# Patient Record
Sex: Female | Born: 1965 | Race: Asian | Hispanic: No | Marital: Married
Health system: Midwestern US, Community
[De-identification: ages and names within clinical notes are randomized; demographics above are authoritative.]

## PROBLEM LIST (undated history)

## (undated) DIAGNOSIS — E785 Hyperlipidemia, unspecified: Secondary | ICD-10-CM

## (undated) DIAGNOSIS — E039 Hypothyroidism, unspecified: Secondary | ICD-10-CM

## (undated) DIAGNOSIS — D869 Sarcoidosis, unspecified: Secondary | ICD-10-CM

## (undated) DIAGNOSIS — M4316 Spondylolisthesis, lumbar region: Secondary | ICD-10-CM

## (undated) HISTORY — DX: Hypothyroidism, unspecified: E03.9

## (undated) HISTORY — DX: Sarcoidosis, unspecified: D86.9

---

## 2007-12-17 ENCOUNTER — Ambulatory Visit: Payer: Self-pay | Admitting: Internal Medicine

## 2008-05-15 ENCOUNTER — Ambulatory Visit: Payer: Self-pay | Admitting: Internal Medicine

## 2008-07-31 ENCOUNTER — Ambulatory Visit: Payer: Self-pay | Admitting: Internal Medicine

## 2008-08-10 ENCOUNTER — Ambulatory Visit: Payer: Self-pay | Admitting: Sports Medicine

## 2008-08-18 ENCOUNTER — Encounter: Payer: Self-pay | Admitting: Sports Medicine

## 2008-09-06 ENCOUNTER — Encounter: Payer: Self-pay | Admitting: Sports Medicine

## 2008-10-06 ENCOUNTER — Encounter: Payer: Self-pay | Admitting: Sports Medicine

## 2008-11-18 ENCOUNTER — Ambulatory Visit: Payer: Self-pay | Admitting: Internal Medicine

## 2008-12-02 ENCOUNTER — Ambulatory Visit: Payer: Self-pay | Admitting: Internal Medicine

## 2009-11-19 ENCOUNTER — Ambulatory Visit: Payer: Self-pay

## 2010-12-30 ENCOUNTER — Ambulatory Visit: Payer: Self-pay | Admitting: Internal Medicine

## 2011-01-21 ENCOUNTER — Ambulatory Visit: Payer: Self-pay | Admitting: Internal Medicine

## 2012-02-12 ENCOUNTER — Encounter: Payer: Self-pay | Admitting: Internal Medicine

## 2012-03-06 ENCOUNTER — Encounter: Payer: Self-pay | Admitting: Internal Medicine

## 2012-04-06 ENCOUNTER — Encounter: Payer: Self-pay | Admitting: Internal Medicine

## 2012-04-08 ENCOUNTER — Ambulatory Visit: Payer: Self-pay | Admitting: Internal Medicine

## 2012-08-27 ENCOUNTER — Ambulatory Visit: Payer: Self-pay | Admitting: Obstetrics and Gynecology

## 2012-09-09 ENCOUNTER — Ambulatory Visit: Payer: Self-pay | Admitting: Internal Medicine

## 2012-10-22 ENCOUNTER — Ambulatory Visit: Payer: Self-pay | Admitting: Orthopedic Surgery

## 2013-01-08 ENCOUNTER — Ambulatory Visit: Payer: Self-pay | Admitting: Otolaryngology

## 2013-01-13 ENCOUNTER — Ambulatory Visit: Payer: Self-pay | Admitting: Otolaryngology

## 2013-05-27 ENCOUNTER — Ambulatory Visit: Payer: Self-pay | Admitting: Obstetrics and Gynecology

## 2013-11-11 ENCOUNTER — Ambulatory Visit: Payer: Self-pay | Admitting: General Practice

## 2014-08-20 ENCOUNTER — Ambulatory Visit: Payer: Self-pay | Admitting: Obstetrics and Gynecology

## 2014-09-15 LAB — HM PAP SMEAR: HM PAP: NORMAL

## 2015-02-26 NOTE — Op Note (Signed)
PATIENT NAME:  Barbara Harris, Barbara Harris MR#:  [id number] DATE OF BIRTH:  12/21/1965  DATE OF PROCEDURE:  01/13/2013  PREOPERATIVE DIAGNOSIS:  1. Lymphadenopathy, left neck.  2. Xerostomia, possible Sjogren's.  POSTOPERATIVE DIAGNOSIS:  1. Lymphadenopathy, left neck. 2. Xerostomia, possible Sjogren's.  PROCEDURE PERFORMED:  1. Excisional biopsy of a deep neck lymph node.  2. Salivary gland biopsy.    ANESTHESIA: General endotracheal anesthesia.   ESTIMATED BLOOD LOSS: Less than 5 mL.   IV FLUIDS: Please see anesthesia record.   COMPLICATIONS: None.   DRAINS AND STENT PLACEMENTS: None.   SPECIMENS:  1. Multiple deep neck lymph nodes from level 2A and 2 B.  2. Salivary gland biopsy from the left lower lip.   INDICATIONS FOR PROCEDURE: The patient is a 49 year old female with a history of sarcoidosis with progressive worsening lymphadenopathy bilaterally, left greater than right. There was some concern regarding other etiologies, as the patient's blood work is equivocal, and the patient was sent for deep neck lymph node biopsy to rule any more ominous pathology. The patient also has a history of possible Sjogren's and xerostomia and was sent also for a salivary gland biopsy as well.   OPERATIVE FINDINGS: Multiple deep neck lymph nodes excised just adjacent to the spinal accessory nerve on the left side. This accessory nerve was intact and stimulated throughout the duration of the case. These were sent for fresh evaluation for a lymphoma workup. Lower lip salivary gland biopsy also performed.   DESCRIPTION OF PROCEDURE: After the patient was identified in holding, benefits and risks of the procedure were discussed and consent was reviewed, the patient was taken to the Operating Room and placed in the supine position. General endotracheal anesthesia was induced, and a shoulder roll was placed. The patient was appropriately positioned for her left neck. Palpation revealed multiple lymph nodes. A  horizontal skin crease was chosen and marked, and 2 mL of 1% lidocaine was injected into the left neck. Another 0.5 mL was injected into the patient's left lower lip. At this time, the patient's neck was prepped and draped in a sterile fashion. A 15-blade scalpel was used to make a horizontal skin incision just along the posterior aspect of the sternocleidomastoid muscle overlying the lymphadenopathy. Dissection was carried bluntly through the subcutaneous tissues until the fascia of the posterior aspect of the sternocleidomastoid was encountered. Just beneath this, exiting the SCM, was the spinal accessory nerve. This was robustly stimulated with the nerve stimulator. Dissection posterior to this revealed a large conglomeration of lymph nodes with the largest approximately 1 cm as well as anterior to the spinal accessory nerve, another lymph node conglomeration of approximately 1 cm. These were both bluntly dissected as well as with bipolar cauterization away from the neck, and care was taken to avoid injury of the spinal accessory nerve, and these were passed off the table for a fresh pathological evaluation. At this time, the patient's neck was copiously irrigated with sterile saline. Meticulous hemostasis was achieved. The spinal accessory nerve was robustly stimulated again, and the neck was closed in a multilayered fashion with deep Vicryls as well as the skin was closed with Dermabond and a Steri-Strip.   At this time, attention was directed to the patient's oral cavity. After this was prepped with Betadine, the left lower lip was palpated. A palpable salivary gland was felt, and a 15-blade scalpel was used to make a horizontal incision. Dissection was carried down to the mucosal layers, and multiple salivary glands were encountered.  A conglomeration of these were meticulously dissected from the deep tissue and sent off for permanent pathological evaluation for Sjogren's. At this time, the patient's oral  cavity was irrigated with sterile saline, and this was closed with 5-0 chromic suture. The patient tolerated the procedure well. At this time, care of the patient was transferred to Anesthesia.    ____________________________ Kyung Ruddreighton C. Vaught, MD ccv:lo D: 01/13/2013 09:54:02 ET T: 01/13/2013 11:23:57 ET JOB#: [id number]  cc: Kyung Ruddreighton C. Vaught, MD, <Dictator> Kyung RuddREIGHTON C VAUGHT MD ELECTRONICALLY SIGNED 01/16/2013 16:18

## 2016-02-28 DIAGNOSIS — Z0000 Encounter for general adult medical examination without abnormal findings: Secondary | ICD-10-CM | POA: Diagnosis not present

## 2016-02-28 DIAGNOSIS — E039 Hypothyroidism, unspecified: Secondary | ICD-10-CM | POA: Diagnosis not present

## 2016-02-28 DIAGNOSIS — E7800 Pure hypercholesterolemia, unspecified: Secondary | ICD-10-CM | POA: Diagnosis not present

## 2016-03-03 DIAGNOSIS — E7800 Pure hypercholesterolemia, unspecified: Secondary | ICD-10-CM | POA: Diagnosis not present

## 2016-03-03 DIAGNOSIS — E039 Hypothyroidism, unspecified: Secondary | ICD-10-CM | POA: Diagnosis not present

## 2016-03-03 DIAGNOSIS — E611 Iron deficiency: Secondary | ICD-10-CM | POA: Diagnosis not present

## 2016-03-03 DIAGNOSIS — R7989 Other specified abnormal findings of blood chemistry: Secondary | ICD-10-CM | POA: Diagnosis not present

## 2016-08-30 DIAGNOSIS — H40013 Open angle with borderline findings, low risk, bilateral: Secondary | ICD-10-CM | POA: Diagnosis not present

## 2016-09-04 DIAGNOSIS — E039 Hypothyroidism, unspecified: Secondary | ICD-10-CM | POA: Diagnosis not present

## 2016-09-04 DIAGNOSIS — E7800 Pure hypercholesterolemia, unspecified: Secondary | ICD-10-CM | POA: Diagnosis not present

## 2016-09-04 DIAGNOSIS — R7989 Other specified abnormal findings of blood chemistry: Secondary | ICD-10-CM | POA: Diagnosis not present

## 2016-09-04 DIAGNOSIS — E611 Iron deficiency: Secondary | ICD-10-CM | POA: Diagnosis not present

## 2016-09-08 ENCOUNTER — Other Ambulatory Visit: Payer: Self-pay | Admitting: Obstetrics and Gynecology

## 2016-09-08 DIAGNOSIS — Z1231 Encounter for screening mammogram for malignant neoplasm of breast: Secondary | ICD-10-CM

## 2016-09-08 DIAGNOSIS — E7800 Pure hypercholesterolemia, unspecified: Secondary | ICD-10-CM | POA: Diagnosis not present

## 2016-09-08 DIAGNOSIS — R7989 Other specified abnormal findings of blood chemistry: Secondary | ICD-10-CM | POA: Diagnosis not present

## 2016-09-08 DIAGNOSIS — Z0000 Encounter for general adult medical examination without abnormal findings: Secondary | ICD-10-CM | POA: Diagnosis not present

## 2016-09-08 DIAGNOSIS — E039 Hypothyroidism, unspecified: Secondary | ICD-10-CM | POA: Diagnosis not present

## 2016-09-13 ENCOUNTER — Ambulatory Visit: Payer: Self-pay | Admitting: Physician Assistant

## 2016-09-13 ENCOUNTER — Encounter: Payer: Self-pay | Admitting: Physician Assistant

## 2016-09-13 VITALS — BP 100/60 | HR 84 | Temp 98.5°F

## 2016-09-13 DIAGNOSIS — J020 Streptococcal pharyngitis: Secondary | ICD-10-CM

## 2016-09-13 LAB — POCT RAPID STREP A (OFFICE): RAPID STREP A SCREEN: POSITIVE — AB

## 2016-09-13 MED ORDER — AMOXICILLIN 875 MG PO TABS: 875.0000 mg | ORAL_TABLET | Freq: Two times a day (BID) | ORAL | 0 refills | Status: DC

## 2016-09-13 NOTE — Progress Notes (Signed)
S: c/o sore throat and fever, her son had strep 3 weeks ago and was given pcn but is better now, her sx started a few days ago and fever started this am, no cough or congestion, some ear pain, no v/d  O: vitals wnl, nad, tms dull, throat red, swollen, neck supple +anterior cervical lymph b/l, lungs c t a , cv rrr, q strep +  A: strep throat  P: amoxil 875mg  bid, work note given as is contagious

## 2016-09-15 ENCOUNTER — Encounter: Payer: Self-pay | Admitting: Radiology

## 2016-09-15 ENCOUNTER — Ambulatory Visit
Admission: RE | Admit: 2016-09-15 | Discharge: 2016-09-15 | Disposition: A | Discharge status: Home / Self Care | Payer: 59 | Source: Ambulatory Visit | Attending: Obstetrics and Gynecology | Admitting: Obstetrics and Gynecology

## 2016-09-15 DIAGNOSIS — Z1231 Encounter for screening mammogram for malignant neoplasm of breast: Secondary | ICD-10-CM | POA: Diagnosis not present

## 2016-09-26 ENCOUNTER — Ambulatory Visit: Payer: Self-pay | Admitting: Physician Assistant

## 2016-09-26 ENCOUNTER — Encounter: Payer: Self-pay | Admitting: Physician Assistant

## 2016-09-26 VITALS — BP 100/70 | HR 72 | Temp 97.9°F

## 2016-09-26 DIAGNOSIS — J029 Acute pharyngitis, unspecified: Secondary | ICD-10-CM

## 2016-09-26 LAB — POCT RAPID STREP A (OFFICE): RAPID STREP A SCREEN: NEGATIVE

## 2016-09-26 MED ORDER — AZITHROMYCIN 250 MG PO TABS: ORAL_TABLET | ORAL | 0 refills | Status: DC

## 2016-09-26 MED ORDER — FLUCONAZOLE 150 MG PO TABS: ORAL_TABLET | ORAL | 0 refills | Status: DC

## 2016-09-26 NOTE — Progress Notes (Signed)
S: had + strep test then took amoxil, started itching with the amoxil but continued to take it until the last day when her lips got swollen and face got swollen, no dif breathing, since then the sore throat has returned, no fever like last time but it feels the same, no v/d, no rash, no cp/sob  O: vitals wnl, nad, ent w injected throat, neck supple no lymph, lungs c t a, cv rrr q strep neg  A: acute pharyngitis, new onset pcn allergy  P: zpack, diflucan,

## 2017-01-15 ENCOUNTER — Inpatient Hospital Stay: Admit: 2017-01-15 | Attending: Family Medicine | Primary: Family Medicine

## 2017-01-15 NOTE — Other (Unsigned)
Patient Acct Nbr: [id number]   Primary AUTH/CERT:   Primary Insurance Company Name: [employer]  Primary Insurance Plan name: [facility]  Primary Insurance Group Number:   Primary Insurance Plan Type: Health  Primary Insurance Policy Number: [id number]

## 2017-09-03 DIAGNOSIS — H5203 Hypermetropia, bilateral: Secondary | ICD-10-CM | POA: Diagnosis not present

## 2017-09-14 DIAGNOSIS — D869 Sarcoidosis, unspecified: Secondary | ICD-10-CM | POA: Diagnosis not present

## 2017-09-14 DIAGNOSIS — E039 Hypothyroidism, unspecified: Secondary | ICD-10-CM | POA: Diagnosis not present

## 2017-09-14 DIAGNOSIS — Z0000 Encounter for general adult medical examination without abnormal findings: Secondary | ICD-10-CM | POA: Diagnosis not present

## 2017-09-14 DIAGNOSIS — E782 Mixed hyperlipidemia: Secondary | ICD-10-CM | POA: Diagnosis not present

## 2017-11-12 ENCOUNTER — Other Ambulatory Visit: Payer: Self-pay | Admitting: Obstetrics and Gynecology

## 2017-11-12 DIAGNOSIS — Z1231 Encounter for screening mammogram for malignant neoplasm of breast: Secondary | ICD-10-CM

## 2017-11-26 ENCOUNTER — Ambulatory Visit
Admission: RE | Admit: 2017-11-26 | Discharge: 2017-11-26 | Disposition: A | Discharge status: Home / Self Care | Payer: No Typology Code available for payment source | Source: Ambulatory Visit | Attending: Obstetrics and Gynecology | Admitting: Obstetrics and Gynecology

## 2017-11-26 DIAGNOSIS — Z1231 Encounter for screening mammogram for malignant neoplasm of breast: Secondary | ICD-10-CM | POA: Insufficient documentation

## 2017-11-30 ENCOUNTER — Ambulatory Visit (INDEPENDENT_AMBULATORY_CARE_PROVIDER_SITE_OTHER): Payer: No Typology Code available for payment source | Admitting: Obstetrics and Gynecology

## 2017-11-30 ENCOUNTER — Encounter: Payer: Self-pay | Admitting: Obstetrics and Gynecology

## 2017-11-30 VITALS — BP 110/64 | Ht 61.0 in | Wt 114.0 lb

## 2017-11-30 DIAGNOSIS — Z1331 Encounter for screening for depression: Secondary | ICD-10-CM | POA: Diagnosis not present

## 2017-11-30 DIAGNOSIS — Z01419 Encounter for gynecological examination (general) (routine) without abnormal findings: Secondary | ICD-10-CM | POA: Diagnosis not present

## 2017-11-30 DIAGNOSIS — Z1339 Encounter for screening examination for other mental health and behavioral disorders: Secondary | ICD-10-CM

## 2017-11-30 DIAGNOSIS — Z124 Encounter for screening for malignant neoplasm of cervix: Secondary | ICD-10-CM | POA: Diagnosis not present

## 2017-11-30 DIAGNOSIS — R6882 Decreased libido: Secondary | ICD-10-CM | POA: Diagnosis not present

## 2017-11-30 NOTE — Progress Notes (Signed)
Routine Annual Gynecology Examination   PCP: Barbette Reichmann, MD  Chief Complaint  Patient presents with  . Annual Exam    History of Present Illness: Patient is an established patient, Barbara y.o. G1P1001 presents for annual exam. The patient has no complaints today.   Menopausal bleeding: denies x 3 years  Menopausal symptoms: denies  Breast symptoms: denies  Last pap smear: 3 years ago.  Result Normal  Last mammogram: 4 days ago.  Result Normal   Decrease in sexual interest.  Some pain with intercourse at all levels of penetration.  Past Medical History:  Diagnosis Date  . Hypothyroidism   . Sarcoidosis     Past Surgical History:  Procedure Laterality Date  . CESAREAN SECTION      Prior to Admission medications   Medication Sig Start Date End Date Taking? Authorizing Provider  Cholecalciferol (VITAMIN D PO) Take by mouth.   Yes [provider]  ferrous sulfate 325 (65 FE) MG tablet Take by mouth.   Yes [provider]  levothyroxine (SYNTHROID) 88 MCG tablet Take by mouth. 09/06/15  Yes [provider]    Allergies  Allergen Reactions  . Amoxil [Amoxicillin] Swelling   Obstetric History: G1P1001, s/p Cesarean section  Social History   Socioeconomic History  . Marital status: Married    Spouse name: Not on file  . Number of children: Not on file  . Years of education: Not on file  . Highest education level: Not on file  Social Needs  . Financial resource strain: Not on file  . Food insecurity - worry: Not on file  . Food insecurity - inability: Not on file  . Transportation needs - medical: Not on file  . Transportation needs - non-medical: Not on file  Occupational History  . Not on file  Tobacco Use  . Smoking status: Never Smoker  . Smokeless tobacco: Never Used  Substance and Sexual Activity  . Alcohol use: No    Frequency: Never  . Drug use: No  . Sexual activity: Yes    Birth control/protection:  Post-menopausal  Other Topics Concern  . Not on file  Social History Narrative  . Not on file    Family History  Problem Relation Age of Onset  . Hypertension Father   . Esophageal cancer Brother     Review of Systems  Constitutional: Negative.   HENT: Negative.   Eyes: Negative.   Respiratory: Negative.   Cardiovascular: Negative.   Gastrointestinal: Negative.   Genitourinary: Negative.   Musculoskeletal: Negative.   Skin: Negative.   Neurological: Negative.   Psychiatric/Behavioral: Negative.      Physical Exam Vitals: BP 110/64   Ht 5\' 1"  (1.549 m)   Wt 114 lb (51.7 kg)   BMI 21.54 kg/m   Physical Exam  Constitutional: She is oriented to person, place, and time. She appears well-developed and well-nourished. No distress.  Genitourinary: Uterus normal. Pelvic exam was performed with patient supine. There is no rash, tenderness, lesion or injury on the right labia. There is no rash, tenderness, lesion or injury on the left labia. No erythema, tenderness or bleeding in the vagina. No signs of injury around the vagina. No vaginal discharge found. Right adnexum does not display mass, does not display tenderness and does not display fullness. Left adnexum does not display mass, does not display tenderness and does not display fullness. Cervix does not exhibit motion tenderness, lesion, discharge or polyp.   Uterus is mobile and anteverted. Uterus  is not enlarged, tender or exhibiting a mass.  HENT:  Head: Normocephalic and atraumatic.  Eyes: EOM are normal. No scleral icterus.  Neck: Normal range of motion. Neck supple. No thyromegaly present.  Cardiovascular: Normal rate and regular rhythm. Exam reveals no gallop and no friction rub.  No murmur heard. Pulmonary/Chest: Effort normal and breath sounds normal. No respiratory distress. She has no wheezes. She has no rales. Right breast exhibits no inverted nipple, no mass, no nipple discharge, no skin change and no tenderness.  Left breast exhibits no inverted nipple, no mass, no nipple discharge, no skin change and no tenderness.  Abdominal: Soft. Bowel sounds are normal. She exhibits no distension and no mass. There is no tenderness. There is no rebound and no guarding.  Musculoskeletal: Normal range of motion. She exhibits no edema or tenderness.  Lymphadenopathy:    She has no cervical adenopathy.       Right: No inguinal adenopathy present.       Left: No inguinal adenopathy present.  Neurological: She is alert and oriented to person, place, and time. No cranial nerve deficit.  Skin: Skin is warm and dry. No rash noted. No erythema.  Psychiatric: She has a normal mood and affect. Her behavior is normal. Judgment normal.    Female chaperone present for pelvic and breast  portions of the physical exam  Results: AUDIT Questionnaire (screen for alcoholism): 0 PHQ-9: 3   Assessment and Plan:  52 y.o. 211P1001 female here for routine annual gynecologic examination  Plan: Problem List Items Addressed This Visit    None    Visit Diagnoses    Women's annual routine gynecological examination    -  Primary   Relevant Orders   IGP, Aptima HPV, rfx 16/18,45   Screening for depression       Screening for alcoholism       Decreased libido       Pap smear for cervical cancer screening       Relevant Orders   IGP, Aptima HPV, rfx 16/18,45      Screening: -- Blood pressure screen normal -- Colonoscopy - due - managed by PCP -- Mammogram - not due until next year -- Weight screening: normal -- Depression screening negative (PHQ-9) -- Nutrition: normal -- cholesterol screening: per PCP -- osteoporosis screening: not due -- tobacco screening: not using -- alcohol screening: AUDIT questionnaire indicates low-risk usage. -- family history of breast cancer screening: done. not at high risk. -- no evidence of domestic violence or intimate partner violence. -- STD screening: gonorrhea/chlamydia NAAT not  collected per patient request. -- pap smear collected per ASCCP guidelines -- HPV vaccination series: not eligilbe  Decreased libido: discussed multiple factors involved.  Discussed use of testosterone potentially. She is not interested in using this and I do not recommend this as a first-line treatment.  Use of OTC lubricants as a first line for now.   Thomasene MohairStephen Shant Hence, MD 11/30/2017 8:38 AM

## 2017-12-04 LAB — IGP, APTIMA HPV, RFX 16/18,45
HPV Aptima: NEGATIVE
PAP SMEAR COMMENT: 0

## 2018-06-10 ENCOUNTER — Other Ambulatory Visit: Payer: Self-pay | Admitting: Internal Medicine

## 2018-06-10 ENCOUNTER — Ambulatory Visit
Admission: RE | Admit: 2018-06-10 | Discharge: 2018-06-10 | Disposition: A | Discharge status: Home / Self Care | Payer: No Typology Code available for payment source | Source: Ambulatory Visit | Attending: Internal Medicine | Admitting: Internal Medicine

## 2018-06-10 DIAGNOSIS — M79604 Pain in right leg: Secondary | ICD-10-CM

## 2018-06-10 DIAGNOSIS — E7800 Pure hypercholesterolemia, unspecified: Secondary | ICD-10-CM | POA: Diagnosis present

## 2018-06-10 DIAGNOSIS — E039 Hypothyroidism, unspecified: Secondary | ICD-10-CM | POA: Diagnosis present

## 2018-06-10 DIAGNOSIS — R7989 Other specified abnormal findings of blood chemistry: Secondary | ICD-10-CM | POA: Diagnosis not present

## 2018-06-10 DIAGNOSIS — M7989 Other specified soft tissue disorders: Secondary | ICD-10-CM | POA: Insufficient documentation

## 2018-08-29 ENCOUNTER — Encounter: Payer: Self-pay | Admitting: *Deleted

## 2018-08-30 ENCOUNTER — Encounter
Admission: RE | Disposition: A | Discharge status: Home / Self Care | Payer: Self-pay | Source: Ambulatory Visit | Attending: Unknown Physician Specialty

## 2018-08-30 ENCOUNTER — Ambulatory Visit: Payer: No Typology Code available for payment source | Admitting: Anesthesiology

## 2018-08-30 ENCOUNTER — Ambulatory Visit
Admission: RE | Admit: 2018-08-30 | Discharge: 2018-08-30 | Disposition: A | Discharge status: Home / Self Care | Payer: No Typology Code available for payment source | Source: Ambulatory Visit | Attending: Unknown Physician Specialty | Admitting: Unknown Physician Specialty

## 2018-08-30 ENCOUNTER — Encounter: Payer: Self-pay | Admitting: *Deleted

## 2018-08-30 DIAGNOSIS — Z79899 Other long term (current) drug therapy: Secondary | ICD-10-CM | POA: Diagnosis not present

## 2018-08-30 DIAGNOSIS — E785 Hyperlipidemia, unspecified: Secondary | ICD-10-CM | POA: Diagnosis not present

## 2018-08-30 DIAGNOSIS — Z1211 Encounter for screening for malignant neoplasm of colon: Secondary | ICD-10-CM | POA: Insufficient documentation

## 2018-08-30 DIAGNOSIS — E039 Hypothyroidism, unspecified: Secondary | ICD-10-CM | POA: Insufficient documentation

## 2018-08-30 DIAGNOSIS — K621 Rectal polyp: Secondary | ICD-10-CM | POA: Diagnosis not present

## 2018-08-30 DIAGNOSIS — D869 Sarcoidosis, unspecified: Secondary | ICD-10-CM | POA: Diagnosis not present

## 2018-08-30 DIAGNOSIS — Z79890 Hormone replacement therapy (postmenopausal): Secondary | ICD-10-CM | POA: Diagnosis not present

## 2018-08-30 HISTORY — DX: Spondylolisthesis, lumbar region: M43.16

## 2018-08-30 HISTORY — DX: Hyperlipidemia, unspecified: E78.5

## 2018-08-30 HISTORY — PX: COLONOSCOPY WITH PROPOFOL: SHX5780

## 2018-08-30 SURGERY — COLONOSCOPY WITH PROPOFOL
Anesthesia: General

## 2018-08-30 MED ORDER — PROPOFOL 500 MG/50ML IV EMUL
INTRAVENOUS | Status: AC
  Filled 2018-08-30: qty 50

## 2018-08-30 MED ORDER — SODIUM CHLORIDE 0.9 % IV SOLN
INTRAVENOUS | Status: DC
  Administered 2018-08-30: 1000 mL via INTRAVENOUS

## 2018-08-30 MED ORDER — LIDOCAINE HCL (PF) 2 % IJ SOLN
INTRAMUSCULAR | Status: AC
  Filled 2018-08-30: qty 10

## 2018-08-30 MED ORDER — PROPOFOL 500 MG/50ML IV EMUL
INTRAVENOUS | Status: DC | PRN
  Administered 2018-08-30: 170 ug/kg/min via INTRAVENOUS

## 2018-08-30 MED ORDER — PROPOFOL 10 MG/ML IV BOLUS
INTRAVENOUS | Status: DC | PRN
  Administered 2018-08-30: 80 mg via INTRAVENOUS

## 2018-08-30 MED ORDER — FENTANYL CITRATE (PF) 100 MCG/2ML IJ SOLN
INTRAMUSCULAR | Status: AC
  Filled 2018-08-30: qty 2

## 2018-08-30 MED ORDER — PHENYLEPHRINE HCL 10 MG/ML IJ SOLN
INTRAMUSCULAR | Status: DC | PRN
  Administered 2018-08-30: 100 ug via INTRAVENOUS

## 2018-08-30 MED ORDER — SODIUM CHLORIDE 0.9 % IV SOLN: INTRAVENOUS | Status: DC

## 2018-08-30 MED ORDER — LIDOCAINE 2% (20 MG/ML) 5 ML SYRINGE
INTRAMUSCULAR | Status: DC | PRN
  Administered 2018-08-30: 30 mg via INTRAVENOUS

## 2018-08-30 MED ORDER — FENTANYL CITRATE (PF) 100 MCG/2ML IJ SOLN
INTRAMUSCULAR | Status: DC | PRN
  Administered 2018-08-30: 50 ug via INTRAVENOUS

## 2018-08-30 NOTE — Op Note (Signed)
[facility] Gastroenterology Patient Name: Barbara Harris Procedure Date: 08/30/2018 8:48 AM MRN: [id number] Account #: [id number] Date of Birth: Nov 05, 1966 Admit Type: Outpatient Age: 52 Room: [facility] ENDO ROOM 1 Gender: Female Note Status: Finalized Procedure:            Colonoscopy Indications:          Screening for colorectal malignant neoplasm Providers:            Scot Jun, MD Referring MD:         Barbette Reichmann, MD (Referring MD) Medicines:            Propofol per Anesthesia Complications:        No immediate complications. Procedure:            Pre-Anesthesia Assessment:                       - After reviewing the risks and benefits, the patient                        was deemed in satisfactory condition to undergo the                        procedure.                       After obtaining informed consent, the colonoscope was                        passed under direct vision. Throughout the procedure,                        the patient's blood pressure, pulse, and oxygen                        saturations were monitored continuously. The                        Colonoscope was introduced through the anus and                        advanced to the the cecum, identified by appendiceal                        orifice and ileocecal valve. The colonoscopy was                        performed without difficulty. The patient tolerated the                        procedure well. The quality of the bowel preparation                        was excellent. Findings:      A diminutive polyp was found in the rectum. The polyp was sessile. The       polyp was removed with a jumbo cold forceps. Resection and retrieval       were complete.      The terminal ileum appeared normal. The inside of the terminal ileum was       normal. The edges of the ileocecal valve showed a mild covering which  suggests possible polyp tissue and was biopsied once. Impression:            - One diminutive polyp in the rectum, removed with a                        jumbo cold forceps. Resected and retrieved.                       - The examined portion of the ileum was normal. Recommendation:       - Await pathology results. Scot Jun, MD 08/30/2018 9:15:49 AM This report has been signed electronically. Number of Addenda: 0 Note Initiated On: 08/30/2018 8:48 AM Scope Withdrawal Time: 0 hours 11 minutes 17 seconds  Total Procedure Duration: 0 hours 17 minutes 37 seconds       [facility]

## 2018-08-30 NOTE — H&P (Signed)
Primary Care Physician:  Barbette Reichmann, MD Primary Gastroenterologist:  Dr. Mechele Collin  Pre-Procedure History & Physical: HPI:  Barbara Harris is a 52 y.o. female is here for an colonoscopy for screening  Colon exam.   Past Medical History:  Diagnosis Date  . Hyperlipidemia   . Hypothyroidism   . Sarcoidosis   . Spondylolisthesis of lumbar region     Past Surgical History:  Procedure Laterality Date  . CESAREAN SECTION      Prior to Admission medications   Medication Sig Start Date End Date Taking? Authorizing Provider  Multiple Vitamin (MULTIVITAMIN) tablet Take 1 tablet by mouth daily.   Yes [provider]  rosuvastatin (CRESTOR) 5 MG tablet Take 5 mg by mouth daily.   Yes [provider]  vitamin B-12 (CYANOCOBALAMIN) 500 MCG tablet Take 500 mcg by mouth daily.   Yes [provider]  ferrous sulfate 325 (65 FE) MG tablet Take by mouth.    [provider]  levothyroxine (SYNTHROID) 88 MCG tablet Take by mouth. 09/06/15   [provider]    Allergies as of 04/24/2018 - Review Complete 11/30/2017  Allergen Reaction Noted  . Amoxil [amoxicillin] Swelling 09/26/2016    Family History  Problem Relation Age of Onset  . Hypertension Father   . Esophageal cancer Brother     Social History   Socioeconomic History  . Marital status: Married    Spouse name: Not on file  . Number of children: Not on file  . Years of education: Not on file  . Highest education level: Not on file  Occupational History  . Not on file  Social Needs  . Financial resource strain: Not on file  . Food insecurity:    Worry: Not on file    Inability: Not on file  . Transportation needs:    Medical: Not on file    Non-medical: Not on file  Tobacco Use  . Smoking status: Never Smoker  . Smokeless tobacco: Never Used  Substance and Sexual Activity  . Alcohol use: No    Frequency: Never  . Drug use: No  . Sexual activity: Yes    Birth  control/protection: Post-menopausal  Lifestyle  . Physical activity:    Days per week: Not on file    Minutes per session: Not on file  . Stress: Not on file  Relationships  . Social connections:    Talks on phone: Not on file    Gets together: Not on file    Attends religious service: Not on file    Active member of club or organization: Not on file    Attends meetings of clubs or organizations: Not on file    Relationship status: Not on file  . Intimate partner violence:    Fear of current or ex partner: Not on file    Emotionally abused: Not on file    Physically abused: Not on file    Forced sexual activity: Not on file  Other Topics Concern  . Not on file  Social History Narrative  . Not on file    Review of Systems: See HPI, otherwise negative ROS  Physical Exam: BP 119/83   Pulse 70   Temp (!) 95.9 F (35.5 C) (Tympanic)   Resp 18   Ht 5\' 1"  (1.549 m)   Wt 52.6 kg   SpO2 100%   BMI 21.92 kg/m  General:   Alert,  pleasant and cooperative in NAD Head:  Normocephalic and atraumatic. Neck:  Supple; no masses or thyromegaly. Lungs:  Clear throughout to auscultation.    Heart:  Regular rate and rhythm. Abdomen:  Soft, nontender and nondistended. Normal bowel sounds, without guarding, and without rebound.   Neurologic:  Alert and  oriented x4;  grossly normal neurologically.  Impression/Plan: Barbara Harris is here for an colonoscopy to be performed for screening colonoscopy  Risks, benefits, limitations, and alternatives regarding  colonoscopy have been reviewed with the patient.  Questions have been answered.  All parties agreeable.   Lynnae Prude, MD  08/30/2018, 8:45 AM

## 2018-08-30 NOTE — Transfer of Care (Signed)
Immediate Anesthesia Transfer of Care Note  Patient: Barbara Harris  Procedure(s) Performed: COLONOSCOPY WITH PROPOFOL (N/A )  Patient Location: PACU and Endoscopy Unit  Anesthesia Type:General  Level of Consciousness: sedated  Airway & Oxygen Therapy: Patient Spontanous Breathing and Patient connected to nasal cannula oxygen  Post-op Assessment: Report given to RN and Post -op Vital signs reviewed and stable  Post vital signs: Reviewed and stable  Last Vitals:  Vitals Value Taken Time  BP 83/55 08/30/2018  9:15 AM  Temp 36.1 C 08/30/2018  9:15 AM  Pulse    Resp 17 08/30/2018  9:15 AM  SpO2 100 % 08/30/2018  9:15 AM    Last Pain:  Vitals:   08/30/18 0915  TempSrc: Tympanic  PainSc: Asleep      Patients Stated Pain Goal: 0 (08/30/18 1610)  Complications: No apparent anesthesia complications

## 2018-08-30 NOTE — Anesthesia Preprocedure Evaluation (Signed)
Anesthesia Evaluation  Patient identified by MRN, date of birth, ID band Patient awake    Reviewed: Allergy & Precautions, NPO status , Patient's Chart, lab work & pertinent test results, reviewed documented beta blocker date and time   Airway Mallampati: II  TM Distance: >3 FB     Dental  (+) Chipped   Pulmonary           Cardiovascular      Neuro/Psych    GI/Hepatic   Endo/Other  Hypothyroidism   Renal/GU      Musculoskeletal   Abdominal   Peds  Hematology   Anesthesia Other Findings Sarcoid.  Reproductive/Obstetrics                             Anesthesia Physical Anesthesia Plan  ASA: III  Anesthesia Plan: General   Post-op Pain Management:    Induction: Intravenous  PONV Risk Score and Plan:   Airway Management Planned:   Additional Equipment:   Intra-op Plan:   Post-operative Plan:   Informed Consent: I have reviewed the patients History and Physical, chart, labs and discussed the procedure including the risks, benefits and alternatives for the proposed anesthesia with the patient or authorized representative who has indicated his/her understanding and acceptance.     Plan Discussed with: CRNA  Anesthesia Plan Comments:         Anesthesia Quick Evaluation

## 2018-08-30 NOTE — Anesthesia Postprocedure Evaluation (Signed)
Anesthesia Post Note  Patient: Barbara Harris  Procedure(s) Performed: COLONOSCOPY WITH PROPOFOL (N/A )  Patient location during evaluation: Endoscopy Anesthesia Type: General Level of consciousness: awake and alert Pain management: pain level controlled Vital Signs Assessment: post-procedure vital signs reviewed and stable Respiratory status: spontaneous breathing, nonlabored ventilation, respiratory function stable and patient connected to nasal cannula oxygen Cardiovascular status: blood pressure returned to baseline and stable Postop Assessment: no apparent nausea or vomiting Anesthetic complications: no     Last Vitals:  Vitals:   08/30/18 0935 08/30/18 0945  BP: 108/71 96/63  Pulse: 64 (!) 58  Resp: 14 17  Temp:    SpO2: 100% 100%    Last Pain:  Vitals:   08/30/18 0945  TempSrc:   PainSc: 0-No pain                 Amaryllis Malmquist S

## 2018-08-30 NOTE — Anesthesia Post-op Follow-up Note (Signed)
Anesthesia QCDR form completed.        

## 2018-09-02 LAB — SURGICAL PATHOLOGY

## 2018-09-03 ENCOUNTER — Encounter: Payer: Self-pay | Admitting: Unknown Physician Specialty

## 2019-01-29 MED FILL — ROSUVASTATIN CALCIUM 5 MG T: 5 | 90 days supply | Qty: 90 | Fill #0

## 2019-01-29 MED FILL — LEVOTHYROXINE 88 MCG TABLET: 88 | 90 days supply | Qty: 90 | Fill #0

## 2019-01-29 MED FILL — VIT D2 1.25 MG (50,000 UNIT: 1.25 MG | 84 days supply | Qty: 12 | Fill #0

## 2019-09-24 ENCOUNTER — Telehealth: Payer: No Typology Code available for payment source | Admitting: Nurse Practitioner

## 2019-09-24 DIAGNOSIS — M5440 Lumbago with sciatica, unspecified side: Secondary | ICD-10-CM

## 2019-09-24 MED ORDER — CYCLOBENZAPRINE HCL 10 MG PO TABS: 10.0000 mg | ORAL_TABLET | Freq: Three times a day (TID) | ORAL | 1 refills | Status: DC | PRN

## 2019-09-24 MED ORDER — NAPROXEN 500 MG PO TABS: 500.0000 mg | ORAL_TABLET | Freq: Two times a day (BID) | ORAL | 1 refills | Status: DC

## 2019-09-24 NOTE — Progress Notes (Signed)

## 2020-09-08 ENCOUNTER — Other Ambulatory Visit: Payer: Self-pay | Admitting: Internal Medicine

## 2020-10-04 ENCOUNTER — Other Ambulatory Visit: Payer: Self-pay | Admitting: Internal Medicine

## 2020-11-29 ENCOUNTER — Other Ambulatory Visit: Payer: Self-pay | Admitting: Internal Medicine

## 2020-12-10 ENCOUNTER — Ambulatory Visit: Payer: No Typology Code available for payment source

## 2020-12-24 ENCOUNTER — Other Ambulatory Visit: Payer: Self-pay | Admitting: Internal Medicine

## 2020-12-31 ENCOUNTER — Ambulatory Visit: Payer: No Typology Code available for payment source | Attending: Internal Medicine

## 2020-12-31 ENCOUNTER — Other Ambulatory Visit: Payer: Self-pay | Admitting: Internal Medicine

## 2020-12-31 DIAGNOSIS — Z23 Encounter for immunization: Secondary | ICD-10-CM

## 2020-12-31 NOTE — Progress Notes (Signed)
   Covid-19 Vaccination Clinic  Name:  Barbara Harris    MRN: [id number] DOB: 1966/04/21  12/31/2020  [name] was observed post Covid-19 immunization for 15 minutes without incident. She was provided with Vaccine Information Sheet and instruction to access the V-Safe system.   [name] was instructed to call 911 with any severe reactions post vaccine: Marland Kitchen Difficulty breathing  . Swelling of face and throat  . A fast heartbeat  . A bad rash all over body  . Dizziness and weakness   Immunizations Administered    Name Date Dose VIS Date Route   PFIZER Comrnaty(Gray TOP) Covid-19 Vaccine 12/31/2020  8:53 AM 0.3 mL 10/14/2020 Intramuscular   Manufacturer: [employer], [employer]   Lot: [id number]   NDC: [phone]

## 2021-01-06 ENCOUNTER — Other Ambulatory Visit: Payer: Self-pay | Admitting: Internal Medicine

## 2021-01-07 ENCOUNTER — Other Ambulatory Visit: Payer: Self-pay | Admitting: Internal Medicine

## 2021-01-07 ENCOUNTER — Other Ambulatory Visit (HOSPITAL_COMMUNITY): Payer: Self-pay | Admitting: Internal Medicine

## 2021-01-07 DIAGNOSIS — R42 Dizziness and giddiness: Secondary | ICD-10-CM

## 2021-01-07 DIAGNOSIS — R2681 Unsteadiness on feet: Secondary | ICD-10-CM

## 2021-01-07 DIAGNOSIS — H9311 Tinnitus, right ear: Secondary | ICD-10-CM

## 2021-01-11 ENCOUNTER — Ambulatory Visit
Admission: RE | Admit: 2021-01-11 | Discharge: 2021-01-11 | Disposition: A | Discharge status: Home / Self Care | Payer: No Typology Code available for payment source | Source: Ambulatory Visit | Attending: Internal Medicine | Admitting: Internal Medicine

## 2021-01-11 ENCOUNTER — Other Ambulatory Visit: Payer: Self-pay

## 2021-01-11 DIAGNOSIS — R42 Dizziness and giddiness: Secondary | ICD-10-CM | POA: Diagnosis not present

## 2021-01-11 DIAGNOSIS — H9311 Tinnitus, right ear: Secondary | ICD-10-CM | POA: Diagnosis present

## 2021-01-11 DIAGNOSIS — R2681 Unsteadiness on feet: Secondary | ICD-10-CM | POA: Diagnosis present

## 2021-01-18 ENCOUNTER — Ambulatory Visit: Payer: No Typology Code available for payment source

## 2021-01-21 ENCOUNTER — Ambulatory Visit: Payer: No Typology Code available for payment source

## 2021-01-27 ENCOUNTER — Other Ambulatory Visit: Payer: Self-pay | Admitting: Unknown Physician Specialty

## 2021-04-08 ENCOUNTER — Other Ambulatory Visit: Payer: Self-pay

## 2021-04-08 MED ORDER — ROSUVASTATIN CALCIUM 5 MG PO TABS
ORAL_TABLET | ORAL | 3 refills | Status: DC
  Filled 2021-04-08: qty 90, 90d supply, fill #0

## 2021-04-08 MED ORDER — CLOTRIMAZOLE-BETAMETHASONE 1-0.05 % EX CREA
TOPICAL_CREAM | Freq: Two times a day (BID) | CUTANEOUS | 1 refills | Status: DC
  Filled 2021-04-08: qty 45, 20d supply, fill #0

## 2021-05-11 ENCOUNTER — Other Ambulatory Visit: Payer: Self-pay

## 2021-05-11 MED FILL — Levothyroxine Sodium Tab 88 MCG: ORAL | 90 days supply | Qty: 90 | Fill #0 | Status: AC

## 2021-07-08 ENCOUNTER — Other Ambulatory Visit: Payer: Self-pay

## 2021-07-08 ENCOUNTER — Ambulatory Visit (INDEPENDENT_AMBULATORY_CARE_PROVIDER_SITE_OTHER): Payer: No Typology Code available for payment source | Admitting: Obstetrics and Gynecology

## 2021-07-08 ENCOUNTER — Encounter: Payer: Self-pay | Admitting: Obstetrics and Gynecology

## 2021-07-08 VITALS — BP 110/60 | Ht 60.0 in | Wt 110.0 lb

## 2021-07-08 DIAGNOSIS — E2839 Other primary ovarian failure: Secondary | ICD-10-CM

## 2021-07-08 DIAGNOSIS — Z01419 Encounter for gynecological examination (general) (routine) without abnormal findings: Secondary | ICD-10-CM

## 2021-07-08 DIAGNOSIS — N941 Unspecified dyspareunia: Secondary | ICD-10-CM

## 2021-07-08 DIAGNOSIS — Z1339 Encounter for screening examination for other mental health and behavioral disorders: Secondary | ICD-10-CM

## 2021-07-08 DIAGNOSIS — Z1331 Encounter for screening for depression: Secondary | ICD-10-CM

## 2021-07-08 MED ORDER — ESTROGENS, CONJUGATED 0.625 MG/GM VA CREA
1.0000 | TOPICAL_CREAM | VAGINAL | 3 refills | Status: DC
Start: 2021-07-11 — End: 2022-07-19
  Filled 2021-07-08: qty 30, 90d supply, fill #0

## 2021-07-08 NOTE — Progress Notes (Signed)
Routine Annual Gynecology Examination   PCP: Barbette Reichmann, MD  Chief Complaint  Patient presents with   Annual Exam    History of Present Illness: Patient is a 56 y.o. G1P1001 presents for annual exam. The patient has no complaints today.   Menopausal bleeding: denies  Menopausal symptoms: rare.  Painful intercourse.    Breast symptoms: denies  Last pap smear: 3.5 years ago.  Result Normal  Last mammogram: 3.5 years ago.  Result Normal  Last colonoscopy: 2019 - 10 year follow up  Past Medical History:  Diagnosis Date   Hyperlipidemia    Hypothyroidism    Sarcoidosis    Spondylolisthesis of lumbar region     Past Surgical History:  Procedure Laterality Date   CESAREAN SECTION     COLONOSCOPY WITH PROPOFOL N/A 08/30/2018   Procedure: COLONOSCOPY WITH PROPOFOL;  Surgeon: Scot Jun, MD;  Location: [facility] ENDOSCOPY;  Service: Endoscopy;  Laterality: N/A;    Prior to Admission medications   Medication Sig Start Date End Date Taking? Authorizing Provider  COVID-19 mRNA Vac-TriS, Pfizer, SUSP injection USE AS DIRECTED 12/31/20 12/31/21  Judyann Munson, MD  levothyroxine (SYNTHROID) 88 MCG tablet Take by mouth. 09/06/15   [provider]  levothyroxine (SYNTHROID) 88 MCG tablet TAKE 1 TABLET BY MOUTH EVERY MORNING BEFORE BREAKFAST (630AM) ON AN EMPTY STOMACH WITH A GLASS OF WATER AT LEAST 30-60 MINUTES BEFORE BREAKFAST 10/04/20 10/04/21  Barbette Reichmann, MD  levothyroxine (SYNTHROID) 88 MCG tablet TAKE 1 TABLET BY MOUTH ONCE DAILY ON AN EMPTY STOMACH WITH A GLASS OF WATER AT LEAST 30-60 MINUTES BEFORE BREAKFAST. 09/08/20 09/08/21  Barbette Reichmann, MD  rosuvastatin (CRESTOR) 5 MG tablet Take 5 mg by mouth daily.    [provider]  rosuvastatin (CRESTOR) 5 MG tablet take one tablet by mouth once daily 04/08/21      Allergies:  No Known Allergies  Obstetric History: G1P1001  Social History   Socioeconomic History   Marital status: Married     Spouse name: Not on file   Number of children: Not on file   Years of education: Not on file   Highest education level: Not on file  Occupational History   Not on file  Tobacco Use   Smoking status: Never   Smokeless tobacco: Never  Vaping Use   Vaping Use: Never used  Substance and Sexual Activity   Alcohol use: No   Drug use: No   Sexual activity: Yes    Birth control/protection: Post-menopausal  Other Topics Concern   Not on file  Social History Narrative   Not on file   Social Determinants of Health   Financial Resource Strain: Not on file  Food Insecurity: Not on file  Transportation Needs: Not on file  Physical Activity: Not on file  Stress: Not on file  Social Connections: Not on file  Intimate Partner Violence: Not on file    Family History  Problem Relation Age of Onset   Hypertension Father    Esophageal cancer Brother     Review of Systems  Constitutional: Negative.   HENT: Negative.    Eyes: Negative.   Respiratory: Negative.    Cardiovascular: Negative.   Gastrointestinal: Negative.   Genitourinary: Negative.   Musculoskeletal: Negative.   Skin: Negative.   Neurological: Negative.   Psychiatric/Behavioral: Negative.      Physical Exam Vitals: BP 110/60   Ht 5' (1.524 m)   Wt 110 lb (49.9 kg)   BMI 21.48 kg/m  Physical Exam Constitutional:      General: She is not in acute distress.    Appearance: Normal appearance. She is well-developed.  Genitourinary:     Vulva and bladder normal.     Right Labia: No rash, tenderness, lesions, skin changes or Bartholin's cyst.    Left Labia: No tenderness, lesions, skin changes, Bartholin's cyst or rash.    No inguinal adenopathy present in the right or left side.    Pelvic Tanner Score: 5/5.    No vaginal discharge, erythema, tenderness or bleeding.      Right Adnexa: not tender, not full and no mass present.    Left Adnexa: not tender, not full and no mass present.    No cervical motion  tenderness, discharge, lesion or polyp.     Uterus is not enlarged or tender.     No uterine mass detected.    Pelvic exam was performed with patient in the lithotomy position.  Breasts:    Right: No inverted nipple, mass, nipple discharge, skin change or tenderness.     Left: No inverted nipple, mass, nipple discharge, skin change or tenderness.  HENT:     Head: Normocephalic and atraumatic.  Eyes:     General: No scleral icterus.    Conjunctiva/sclera: Conjunctivae normal.  Neck:     Thyroid: No thyromegaly.  Cardiovascular:     Rate and Rhythm: Normal rate and regular rhythm.     Heart sounds: No murmur heard.   No friction rub. No gallop.  Pulmonary:     Effort: Pulmonary effort is normal. No respiratory distress.     Breath sounds: Normal breath sounds. No wheezing or rales.  Abdominal:     General: Bowel sounds are normal. There is no distension.     Palpations: Abdomen is soft. There is no mass.     Tenderness: There is no abdominal tenderness. There is no guarding or rebound.     Hernia: There is no hernia in the left inguinal area or right inguinal area.  Musculoskeletal:        General: No swelling or tenderness. Normal range of motion.     Cervical back: Normal range of motion and neck supple.  Lymphadenopathy:     Cervical: No cervical adenopathy.     Lower Body: No right inguinal adenopathy. No left inguinal adenopathy.  Neurological:     General: No focal deficit present.     Mental Status: She is alert and oriented to person, place, and time.     Cranial Nerves: No cranial nerve deficit.  Skin:    General: Skin is warm and dry.     Findings: No erythema or rash.  Psychiatric:        Mood and Affect: Mood normal.        Behavior: Behavior normal.        Judgment: Judgment normal.     Female chaperone present for pelvic and breast  portions of the physical exam  Screening: Audit (alcohol screen): 0 PHQ9 (depression screen): 1  Assessment and Plan:  55  y.o. G49P1001 female here for routine annual gynecologic examination  Plan: Problem List Items Addressed This Visit   None Visit Diagnoses     Women's annual routine gynecological examination    -  Primary   Screening for depression       Screening for alcoholism           Screening: -- Blood pressure screen normal -- Colonoscopy - not due --  Mammogram - due. Patient to call [facility] to arrange. She understands that it is her responsibility to arrange this. -- Weight screening: normal -- Depression screening negative (PHQ-9) -- Nutrition: normal -- cholesterol screening: per PCP -- osteoporosis screening: not due -- tobacco screening: not using -- alcohol screening: AUDIT questionnaire indicates low-risk usage. -- family history of breast cancer screening: done. not at high risk. -- no evidence of domestic violence or intimate partner violence. -- STD screening: gonorrhea/chlamydia NAAT not collected per patient request. -- pap smear not collected per ASCCP guidelines -- HPV vaccination series: not eligilbe  Vaginal atrophy/dyspareunia: start topical vaginal estrogen.   Thomasene Mohair, MD 07/08/2021 2:39 PM

## 2021-09-12 ENCOUNTER — Other Ambulatory Visit: Payer: Self-pay

## 2021-09-12 MED ORDER — FLUCONAZOLE 100 MG PO TABS
ORAL_TABLET | ORAL | 1 refills | Status: DC
  Filled 2021-09-12: qty 7, 7d supply, fill #0

## 2021-09-12 MED ORDER — HYDROCORTISONE (PERIANAL) 2.5 % EX CREA
TOPICAL_CREAM | CUTANEOUS | 0 refills | Status: DC
  Filled 2021-09-12: qty 30, 10d supply, fill #0

## 2021-09-12 MED ORDER — PREDNISONE 10 MG PO TABS
ORAL_TABLET | ORAL | 0 refills | Status: DC
  Filled 2021-09-12: qty 12, 8d supply, fill #0

## 2021-09-13 ENCOUNTER — Other Ambulatory Visit: Payer: Self-pay

## 2021-09-13 MED ORDER — CEFUROXIME AXETIL 500 MG PO TABS
ORAL_TABLET | ORAL | 0 refills | Status: DC
  Filled 2021-09-13: qty 10, 5d supply, fill #0

## 2021-09-14 ENCOUNTER — Other Ambulatory Visit: Payer: Self-pay

## 2021-10-03 ENCOUNTER — Other Ambulatory Visit: Payer: Self-pay

## 2021-10-03 MED FILL — Ergocalciferol Cap 1.25 MG (50000 Unit): ORAL | 90 days supply | Qty: 13 | Fill #0 | Status: AC

## 2021-10-04 ENCOUNTER — Other Ambulatory Visit: Payer: Self-pay

## 2021-10-04 MED ORDER — FLUCONAZOLE 100 MG PO TABS
100.0000 mg | ORAL_TABLET | Freq: Every day | ORAL | 0 refills | Status: DC
  Filled 2021-10-04 – 2021-10-05 (×2): qty 10, 10d supply, fill #0

## 2021-10-04 MED ORDER — KETOCONAZOLE 2 % EX CREA
TOPICAL_CREAM | CUTANEOUS | 0 refills | Status: DC
  Filled 2021-10-04: qty 60, 15d supply, fill #0

## 2021-10-05 ENCOUNTER — Other Ambulatory Visit: Payer: Self-pay

## 2021-10-10 ENCOUNTER — Other Ambulatory Visit: Payer: Self-pay

## 2021-10-11 ENCOUNTER — Other Ambulatory Visit: Payer: Self-pay

## 2021-10-11 MED ORDER — LEVOTHYROXINE SODIUM 88 MCG PO TABS
ORAL_TABLET | ORAL | 1 refills | Status: DC
  Filled 2021-10-11: qty 90, 90d supply, fill #0
  Filled 2022-02-15: qty 90, 90d supply, fill #1

## 2021-10-12 ENCOUNTER — Other Ambulatory Visit: Payer: Self-pay | Admitting: Obstetrics and Gynecology

## 2021-10-12 DIAGNOSIS — Z1231 Encounter for screening mammogram for malignant neoplasm of breast: Secondary | ICD-10-CM

## 2021-11-11 ENCOUNTER — Ambulatory Visit
Admission: RE | Admit: 2021-11-11 | Discharge: 2021-11-11 | Disposition: A | Discharge status: Home / Self Care | Payer: No Typology Code available for payment source | Source: Ambulatory Visit | Attending: Obstetrics and Gynecology | Admitting: Obstetrics and Gynecology

## 2021-11-11 ENCOUNTER — Other Ambulatory Visit: Payer: Self-pay

## 2021-11-11 DIAGNOSIS — Z1231 Encounter for screening mammogram for malignant neoplasm of breast: Secondary | ICD-10-CM

## 2021-11-22 IMAGING — MR MR HEAD W/O CM
13 series · 43 of 48 positions shown · non-contrast
Comparison: None.

CLINICAL DATA: Vertigo.

EXAM:
MRI HEAD WITHOUT CONTRAST
TECHNIQUE: Multiplanar, multiecho pulse sequences of the brain and surrounding
structures were obtained without intravenous contrast.

[Series 9: T1 · sagittal · 5.0mm · 0.62mm/px · 3 of 22 slices shown (1 of 2)]
[im 1/22]
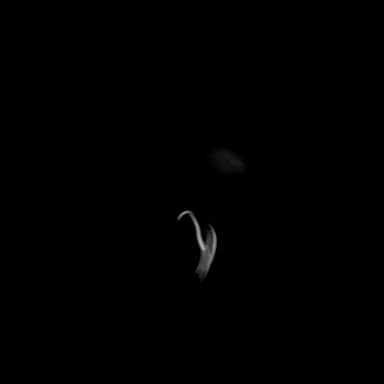
[im 11/22]
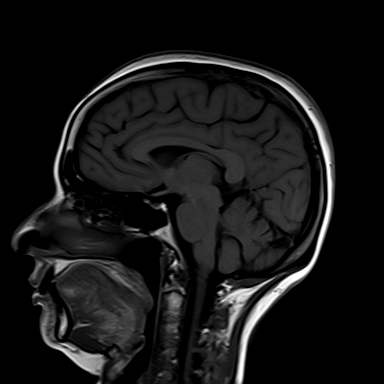
[im 22/22]
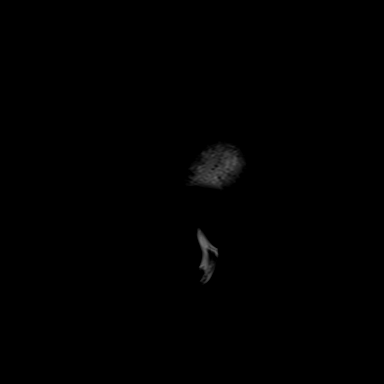

[Series 10: ax dwi_tracew · axial · 3.0mm · 0.65mm/px · z∈[-113,+41]mm · 3 of 48 slices shown]
[im 1/48]
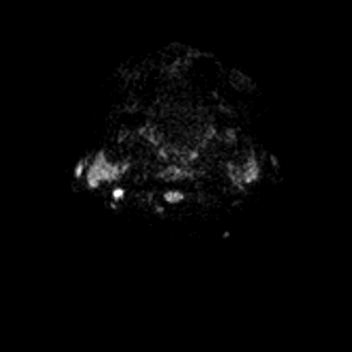
[im 24/48]
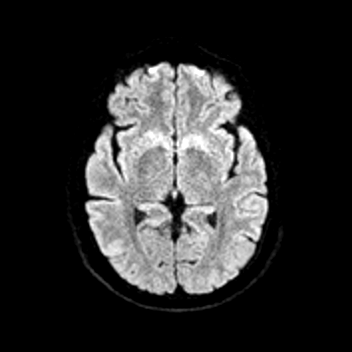
[im 48/48]
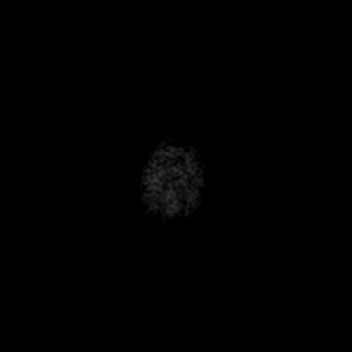

[Series 11: ax dwi_adc · axial · 3.0mm · 0.65mm/px · z∈[-113,+41]mm · 3 of 48 slices shown]
[im 1/48]
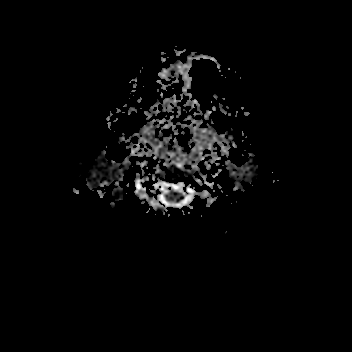
[im 24/48]
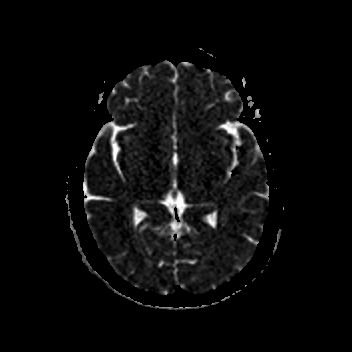
[im 48/48]
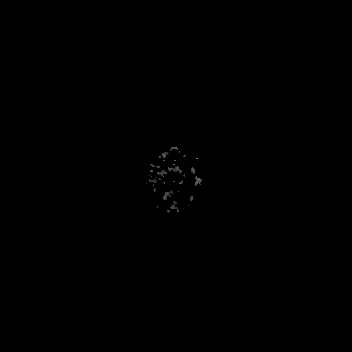

[Series 12: cor dwi_tracew · coronal · 5.0mm · 0.68mm/px · 2 of 36 slices shown]
[im 1/36]
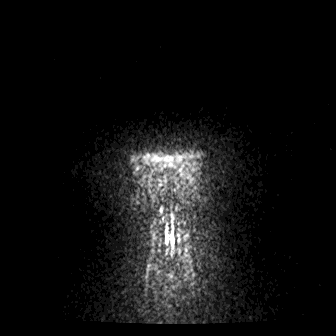
[im 36/36]
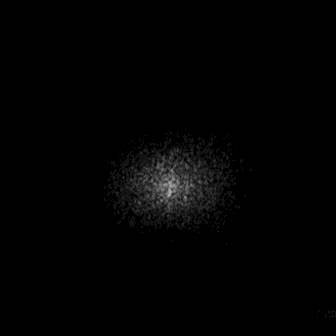

[Series 13: cor dwi_adc · coronal · 5.0mm · 0.68mm/px · 2 of 35 slices shown]
[im 1/35]
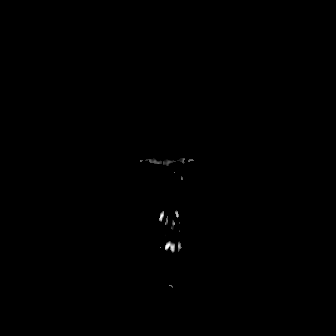
[im 35/35]
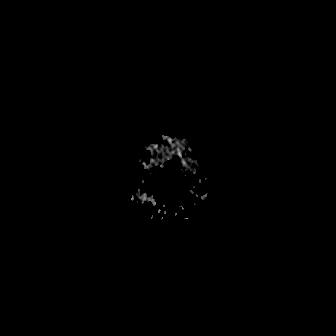

[Series 14: T2 · axial · 5.0mm · 0.53mm/px · z∈[-107,+36]mm · 2 of 25 slices shown (1 of 2)]
[im 1/25]
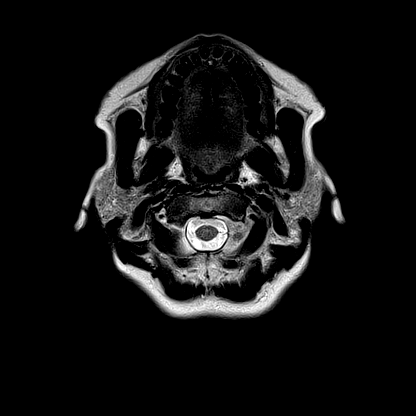
[im 25/25]
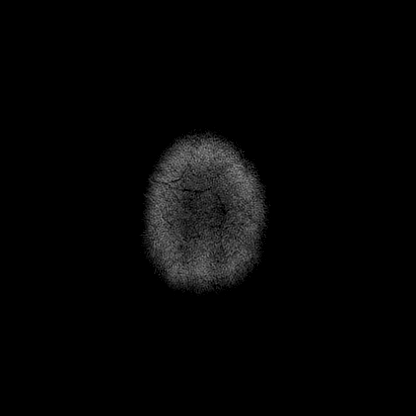

[Series 15: mag_images · axial · 3.0mm · 0.90mm/px · z∈[-124,+51]mm · 4 of 60 slices shown]
[im 1/60]
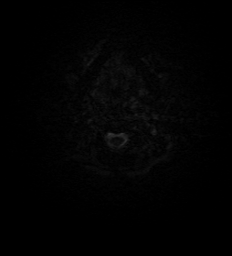
[im 20/60]
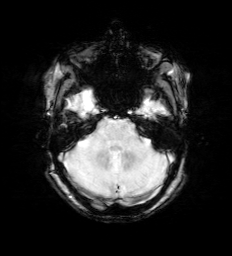
[im 40/60]
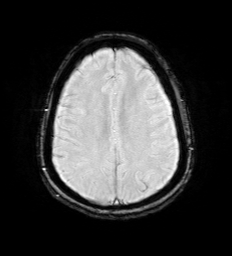
[im 60/60]
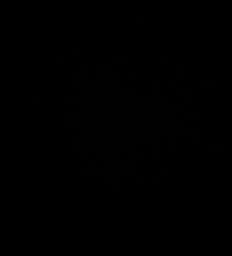

[Series 16: pha_images · axial · 3.0mm · 0.90mm/px · z∈[-124,+51]mm · 4 of 58 slices shown]
[im 1/58]
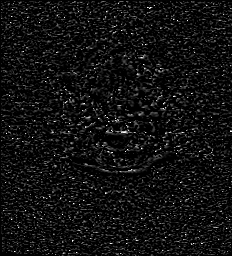
[im 20/58]
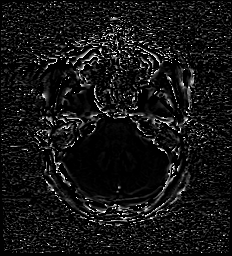
[im 39/58]
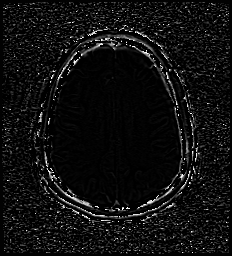
[im 58/58]
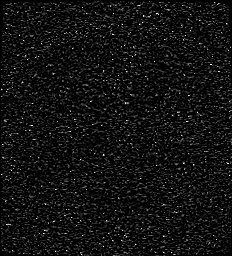

[Series 17: swi_images · axial · 3.0mm · 0.90mm/px · z∈[-124,+51]mm · 4 of 60 slices shown]
[im 1/60]
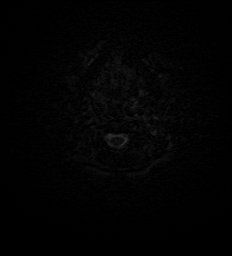
[im 20/60]
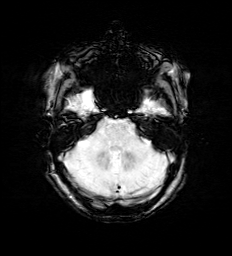
[im 40/60]
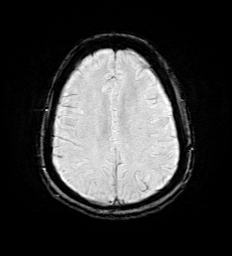
[im 60/60]
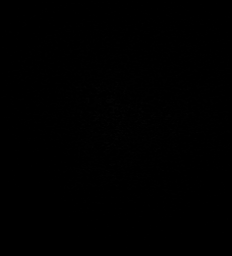

[Series 19: T2 · coronal · 5.0mm · 0.57mm/px · 2 of 29 slices shown (2 of 2)]
[im 1/29]
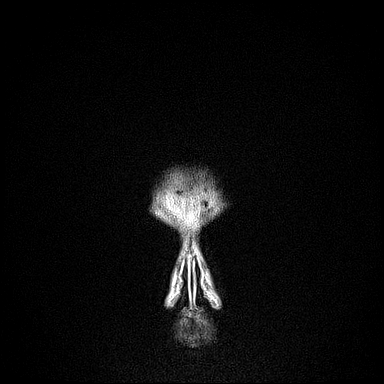
[im 29/29]
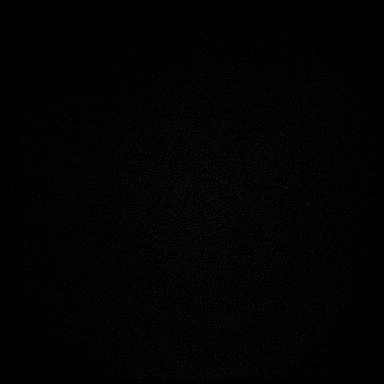

[Series 20: FLAIR · axial · 3.0mm · 0.53mm/px · z∈[-116,+45]mm · 4 of 55 slices shown]
[im 1/55]
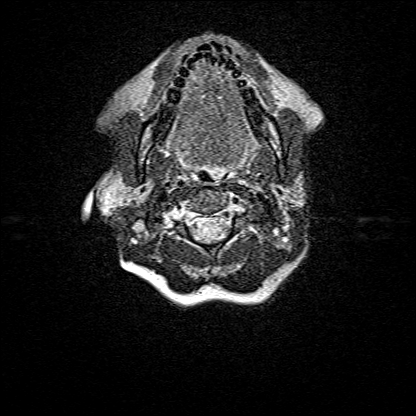
[im 19/55]
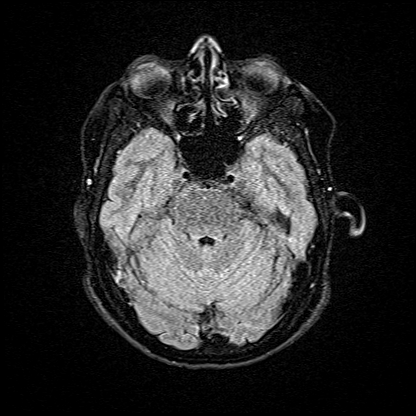
[im 37/55]
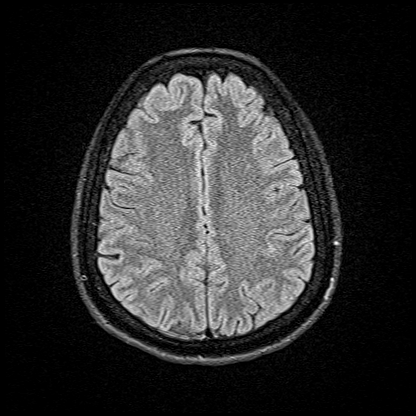
[im 55/55]
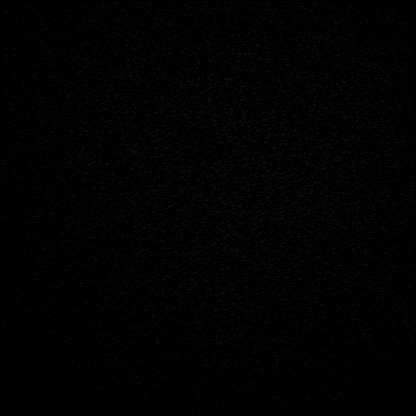

[Series 21: T1 · axial · 1.0mm · 0.98mm/px · z∈[-109,+48]mm · 8 of 160 slices shown (2 of 2)]
[im 1/160]
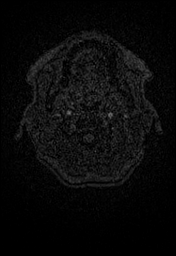
[im 32/160]
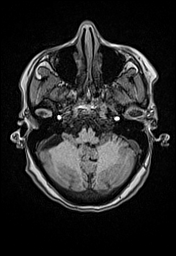
[im 48/160]
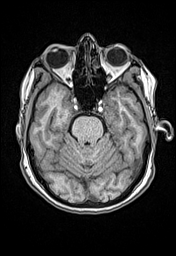
[im 64/160]
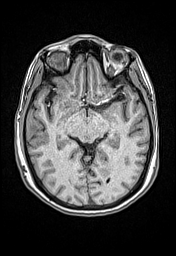
[im 96/160]
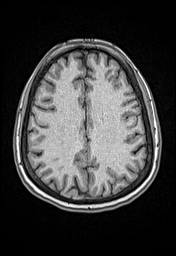
[im 112/160]
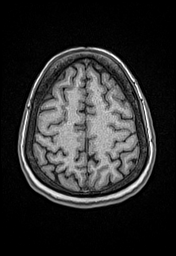
[im 128/160]
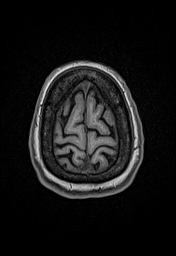
[im 160/160]
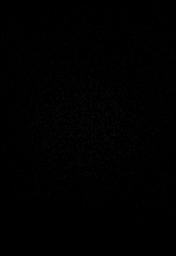

[Series 22: t2_space_tra_(id)_iso · axial · 0.6mm · 0.30mm/px · z∈[-101,-90]mm · 2 of 60 slices shown]
[im 1/60]
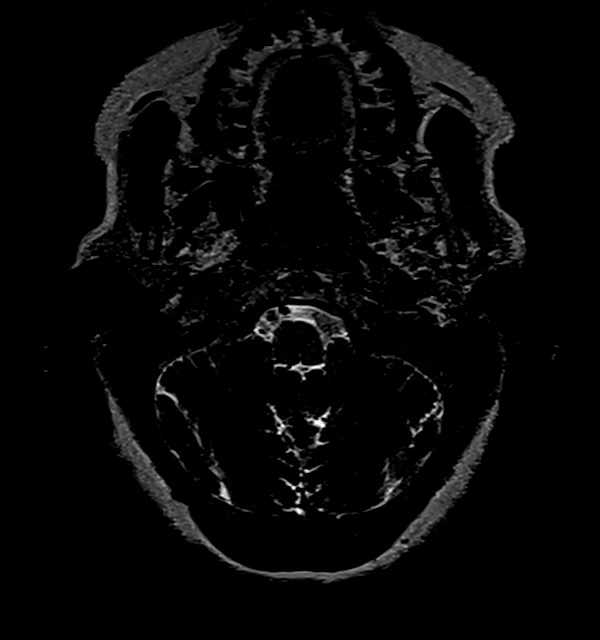
[im 20/60]
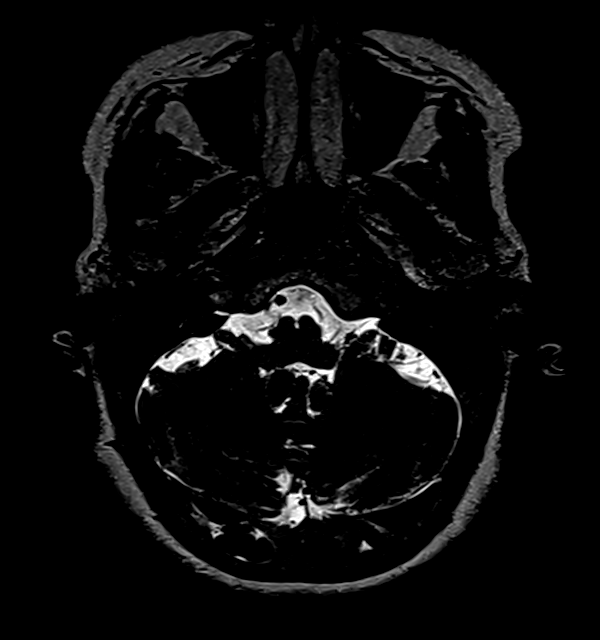

[43 of 48 positions shown; findings below may reference images not displayed]

FINDINGS: Brain: No acute infarction, hemorrhage, hydrocephalus, extra-axial
collection or mass lesion. The brain parenchyma has normal
morphology and signal characteristics.

No cerebellopontine angle mass or internal auditory canal lesion is
demonstrated.Normal appearance of the 7th and 8th cranial nerves
bilaterally.

Vascular: Normal flow voids.

Skull and upper cervical spine: Normal marrow signal.

Sinuses/Orbits: Mucosal thickening of the left ethmoid cells. The
orbits are maintained.
IMPRESSION: Unremarkable MRI of the brain.

## 2021-12-19 ENCOUNTER — Other Ambulatory Visit: Payer: Self-pay

## 2021-12-19 MED ORDER — CLOTRIMAZOLE-BETAMETHASONE 1-0.05 % EX CREA
TOPICAL_CREAM | Freq: Two times a day (BID) | CUTANEOUS | 1 refills | Status: AC
  Filled 2021-12-19: qty 45, 10d supply, fill #0

## 2022-02-16 ENCOUNTER — Other Ambulatory Visit: Payer: Self-pay

## 2022-03-20 ENCOUNTER — Other Ambulatory Visit: Payer: Self-pay | Admitting: Physician Assistant

## 2022-03-20 ENCOUNTER — Ambulatory Visit
Admission: RE | Admit: 2022-03-20 | Discharge: 2022-03-20 | Disposition: A | Discharge status: Home / Self Care | Payer: No Typology Code available for payment source | Source: Ambulatory Visit | Attending: Physician Assistant | Admitting: Physician Assistant

## 2022-03-20 ENCOUNTER — Other Ambulatory Visit: Payer: Self-pay

## 2022-03-20 DIAGNOSIS — R1011 Right upper quadrant pain: Secondary | ICD-10-CM

## 2022-03-20 MED ORDER — TRAMADOL HCL 50 MG PO TABS
ORAL_TABLET | ORAL | 0 refills | Status: DC
  Filled 2022-03-20: qty 20, 5d supply, fill #0

## 2022-03-21 ENCOUNTER — Ambulatory Visit: Payer: No Typology Code available for payment source

## 2022-03-22 ENCOUNTER — Emergency Department: Payer: No Typology Code available for payment source

## 2022-03-22 ENCOUNTER — Other Ambulatory Visit: Payer: Self-pay

## 2022-03-22 ENCOUNTER — Emergency Department
Admission: EM | Admit: 2022-03-22 | Discharge: 2022-03-22 | Disposition: A | Discharge status: Home / Self Care | Payer: No Typology Code available for payment source | Attending: Emergency Medicine | Admitting: Emergency Medicine

## 2022-03-22 ENCOUNTER — Encounter: Payer: Self-pay | Admitting: Emergency Medicine

## 2022-03-22 DIAGNOSIS — R791 Abnormal coagulation profile: Secondary | ICD-10-CM | POA: Insufficient documentation

## 2022-03-22 DIAGNOSIS — R1011 Right upper quadrant pain: Secondary | ICD-10-CM | POA: Insufficient documentation

## 2022-03-22 DIAGNOSIS — E039 Hypothyroidism, unspecified: Secondary | ICD-10-CM | POA: Diagnosis not present

## 2022-03-22 LAB — COMPREHENSIVE METABOLIC PANEL
ALT: 20 U/L (ref 0–44)
AST: 29 U/L (ref 15–41)
Albumin: 3.8 g/dL (ref 3.5–5.0)
Alkaline Phosphatase: 78 U/L (ref 38–126)
Anion gap: 8 (ref 5–15)
BUN: 15 mg/dL (ref 6–20)
CO2: 26 mmol/L (ref 22–32)
Calcium: 9.2 mg/dL (ref 8.9–10.3)
Chloride: 104 mmol/L (ref 98–111)
Creatinine, Ser: 0.96 mg/dL (ref 0.44–1.00)
GFR, Estimated: >60 mL/min (ref >60)
Glucose, Bld: 91 mg/dL (ref 70–99)
Potassium: 4.2 mmol/L (ref 3.5–5.1)
Sodium: 138 mmol/L (ref 135–145)
Total Bilirubin: 0.6 mg/dL (ref 0.3–1.2)
Total Protein: 7.9 g/dL (ref 6.5–8.1)

## 2022-03-22 LAB — URINALYSIS, COMPLETE (UACMP) WITH MICROSCOPIC
Bacteria, UA: NONE SEEN
Bilirubin Urine: NEGATIVE
Glucose, UA: NEGATIVE mg/dL
Hgb urine dipstick: NEGATIVE
Ketones, ur: NEGATIVE mg/dL
Leukocytes,Ua: NEGATIVE
Nitrite: NEGATIVE
Protein, ur: NEGATIVE mg/dL
Specific Gravity, Urine: 1.01 (ref 1.005–1.030)
Squamous Epithelial / HPF: NONE SEEN (ref 0–5)
pH: 7 (ref 5.0–8.0)

## 2022-03-22 LAB — CBC WITH DIFFERENTIAL/PLATELET
Abs Immature Granulocytes: 0.02 10*3/uL (ref 0.00–0.07)
Basophils Absolute: 0.1 10*3/uL (ref 0.0–0.1)
Basophils Relative: 1 %
Eosinophils Absolute: 0.5 10*3/uL (ref 0.0–0.5)
Eosinophils Relative: 6 %
HCT: 39.4 % (ref 36.0–46.0)
Hemoglobin: 12.3 g/dL (ref 12.0–15.0)
Immature Granulocytes: 0 %
Lymphocytes Relative: 19 %
Lymphs Abs: 1.6 10*3/uL (ref 0.7–4.0)
MCH: 26.6 pg (ref 26.0–34.0)
MCHC: 31.2 g/dL (ref 30.0–36.0)
MCV: 85.3 fL (ref 80.0–100.0)
Monocytes Absolute: 0.7 10*3/uL (ref 0.1–1.0)
Monocytes Relative: 8 %
Neutro Abs: 5.4 10*3/uL (ref 1.7–7.7)
Neutrophils Relative %: 66 %
Platelets: 305 10*3/uL (ref 150–400)
RBC: 4.62 MIL/uL (ref 3.87–5.11)
RDW: 14.2 % (ref 11.5–15.5)
WBC: 8.3 10*3/uL (ref 4.0–10.5)
nRBC: 0 % (ref 0.0–0.2)

## 2022-03-22 LAB — TROPONIN I (HIGH SENSITIVITY)
Troponin I (High Sensitivity): 4 ng/L (ref <18)
Troponin I (High Sensitivity): 3 ng/L (ref <18)

## 2022-03-22 LAB — LIPASE, BLOOD: Lipase: 32 U/L (ref 11–51)

## 2022-03-22 LAB — D-DIMER, QUANTITATIVE: D-Dimer, Quant: 3.01 ug/mL-FEU — ABNORMAL HIGH (ref 0.00–0.50)

## 2022-03-22 MED ORDER — CYCLOBENZAPRINE HCL 10 MG PO TABS
10.0000 mg | ORAL_TABLET | Freq: Three times a day (TID) | ORAL | 0 refills | Status: AC | PRN
  Filled 2022-03-22: qty 12, 4d supply, fill #0

## 2022-03-22 MED ORDER — IOHEXOL 350 MG/ML SOLN
75.0000 mL | Freq: Once | INTRAVENOUS | Status: AC | PRN
  Administered 2022-03-22: 75 mL via INTRAVENOUS

## 2022-03-22 NOTE — ED Notes (Signed)
Patient transported to CT 

## 2022-03-22 NOTE — ED Provider Notes (Signed)
Patient received in signout from Dr. Don Perking today pending follow-up CT imaging.  CT imaging on my interpretation does not show any evidence of large PE.  Per radiology report there is no acute findings.  Her blood work is reassuring.  Serial enzymes are negative.  Repeat abdominal exam is soft and benign.  Given patient's reassuring work-up does appear stable and appropriate for outpatient follow-up.  Patient agreeable to plan. ?  ?Willy Eddy, MD ?03/22/22 [phone] ? ?

## 2022-03-22 NOTE — ED Provider Notes (Signed)
? ?[facility] ?Provider Note ? ? ? Event Date/Time  ? First MD Initiated Contact with Patient 03/22/22 0536   ?  (approximate) ? ? ?History  ? ?Abdominal Pain ? ? ?HPI ? ?Barbara Harris Height is a 56 y.o. female with a history of sarcoidosis, hypothyroidism, hyperlipidemia who presents for evaluation of abdominal pain.  Patient has had 6 days of sharp right upper quadrant abdominal pain.  Initially was intermittent but over the last 2 days has been persistent.  The pain is worse with deep inspiration and sneezing.  She denies chest pain, shortness of breath, cough or fever.  No dysuria or hematuria.  Initially the pain was worse postprandially.  She denies any prior history of PE or DVT, recent travel immobilization, leg pain or swelling, hemoptysis, exogenous hormones.  She saw her primary care doctor 2 days ago and had labs and a right upper quadrant ultrasound which were unremarkable.  She was prescribed tramadol which helps with the pain but she really wants to know what is the cause of this pain.  She denies any prior abdominal surgeries other than a c-section ?  ? ? ?Past Medical History:  ?Diagnosis Date  ? Hyperlipidemia   ? Hypothyroidism   ? Sarcoidosis   ? Spondylolisthesis of lumbar region   ? ? ?Past Surgical History:  ?Procedure Laterality Date  ? CESAREAN SECTION    ? COLONOSCOPY WITH PROPOFOL N/A 08/30/2018  ? Procedure: COLONOSCOPY WITH PROPOFOL;  Surgeon: Scot JunElliott, Robert T, MD;  [facility];  Service: Endoscopy;  Laterality: N/A;  ? ? ? ?Physical Exam  ? ?Triage Vital Signs: ?ED Triage Vitals  ?Enc Vitals Group  ?   BP 03/22/22 0531 122/89  ?   Pulse Rate 03/22/22 0531 79  ?   Resp 03/22/22 0531 16  ?   Temp 03/22/22 0531 97.7 ?F (36.5 ?C)  ?   Temp Source 03/22/22 0531 Oral  ?   SpO2 03/22/22 0531 99 %  ?   Weight 03/22/22 0529 111 lb (50.3 kg)  ?   Height 03/22/22 0529 5' (1.524 m)  ?   Head Circumference --   ?   Peak Flow --   ?   Pain Score 03/22/22 0529 9  ?   Pain  Loc --   ?   Pain Edu? --   ?   Excl. in GC? --   ? ? ?Most recent vital signs: ?Vitals:  ? 03/22/22 0531  ?BP: 122/89  ?Pulse: 79  ?Resp: 16  ?Temp: 97.7 ?F (36.5 ?C)  ?SpO2: 99%  ? ? ? ?Constitutional: Alert and oriented. Well appearing and in no apparent distress. ?HEENT: ?     Head: Normocephalic and atraumatic.    ?     Eyes: Conjunctivae are normal. Sclera is non-icteric.  ?     Mouth/Throat: Mucous membranes are moist.  ?     Neck: Supple with no signs of meningismus. ?Cardiovascular: Regular rate and rhythm. No murmurs, gallops, or rubs. 2+ symmetrical distal pulses are present in all extremities.  ?Respiratory: Normal respiratory effort. Lungs are clear to auscultation bilaterally.  ?Gastrointestinal: Soft, non tender, and non distended with positive bowel sounds. No rebound or guarding. ?Genitourinary: No CVA tenderness. ?Musculoskeletal:  No edema, cyanosis, or erythema of extremities. ?Neurologic: Normal speech and language. Face is symmetric. Moving all extremities. No gross focal neurologic deficits are appreciated. ?Skin: Skin is warm, dry and intact. No rash noted. ?Psychiatric: Mood and affect are normal. Speech  and behavior are normal. ? ?ED Results / Procedures / Treatments  ? ?Labs ?(all labs ordered are listed, but only abnormal results are displayed) ?Labs Reviewed  ?D-DIMER, QUANTITATIVE - Abnormal; Notable for the following components:  ?    Result Value  ? D-Dimer, Quant 3.01 (*)   ? All other components within normal limits  ?URINALYSIS, COMPLETE (UACMP) WITH MICROSCOPIC - Abnormal; Notable for the following components:  ? Color, Urine STRAW (*)   ? APPearance CLEAR (*)   ? All other components within normal limits  ?CBC WITH DIFFERENTIAL/PLATELET  ?COMPREHENSIVE METABOLIC PANEL  ?LIPASE, BLOOD  ?TROPONIN I (HIGH SENSITIVITY)  ? ? ? ?EKG ? ?ED ECG REPORT ?I, Nita Sickle, the attending physician, personally viewed and interpreted this ECG. ? ?Sinus rhythm with a rate of 67, normal  intervals, normal axis, anterior Q waves with no ST elevations or depressions.  No prior for comparison ? ?RADIOLOGY ?I, Nita Sickle, attending MD, have personally viewed and interpreted the images obtained during this visit as below: ? ?Chest x-ray negative ? ? ?___________________________________________________ ?Interpretation by Radiologist:  ?DG Chest Portable 1 View ? ?Result Date: 03/22/2022 ?CLINICAL DATA:  Chest pain with history of sarcoidosis. EXAM: PORTABLE CHEST 1 VIEW COMPARISON:  PA Lat 11/11/2013. FINDINGS: The heart size and mediastinal contours are within normal limits. Both lungs are clear. The visualized skeletal structures are unremarkable. IMPRESSION: No evidence of acute chest disease or interval changes. Electronically Signed   By: Almira Bar M.D.   On: 03/22/2022 06:12   ? ? ? ? ?PROCEDURES: ? ?Critical Care performed: No ? ?Procedures ? ? ? ?IMPRESSION / MDM / ASSESSMENT AND PLAN / ED COURSE  ?I reviewed the triage vital signs and the nursing notes. ? ? 57 y.o. female with a history of sarcoidosis, hypothyroidism, hyperlipidemia who presents for evaluation of 6 days sharp RUQ abdominal pain. Now constant, worse with inspiration.  On exam patient is well-appearing in no distress with normal vital signs.  Abdomen soft with no significant tenderness throughout.  Ultrasound done as an outpatient was reviewed by me with no signs of gallstones or cholecystitis ? ?Ddx: Kidney stone versus pyelonephritis versus costochondritis versus PE versus pneumonia versus diverticulitis versus pancreatitis ? ? ?Plan: EKG, troponin, CBC, BMP, D-dimer, chest x-ray, urinalysis.  Patient placed on telemetry for close monitoring of cardiorespiratory status. ? ? ?MEDICATIONS GIVEN IN ED: ?Medications - No data to display ? ? ?ED COURSE: EKG and troponin with no abnormalities.  Chest x-ray with no abnormalities.  UA with no signs of blood or UTI.  LFTs and lipase are within normal limits.  No leukocytosis.   Normal CMP.  D-dimer is elevated at 3.01.  We will proceed with CT angio of the chest and CT abdomen pelvis.  Care transferred to the problems ? ? ?Consults: none ? ? ?EMR reviewed including records from her visit with her primary care doctor from 4 days ago and imaging done for the same complaint ? ? ? ?FINAL CLINICAL IMPRESSION(S) / ED DIAGNOSES  ? ?Final diagnoses:  ?RUQ abdominal pain  ? ? ? ?Rx / DC Orders  ? ?ED Discharge Orders   ? ? None  ? ?  ? ? ? ?Note:  This document was prepared using Dragon voice recognition software and may include unintentional dictation errors. ? ? ?Please note:  Patient was evaluated in Emergency Department today for the symptoms described in the history of present illness. Patient was evaluated in the context of the global  COVID-19 pandemic, which necessitated consideration that the patient might be at risk for infection with the SARS-CoV-2 virus that causes COVID-19. Institutional protocols and algorithms that pertain to the evaluation of patients at risk for COVID-19 are in a state of rapid change based on information released by regulatory bodies including the CDC and federal and state organizations. These policies and algorithms were followed during the patient's care in the ED.  Some ED evaluations and interventions may be delayed as a result of limited staffing during the pandemic. ? ? ? ? ?  ?Nita Sickle, MD ?03/22/22 2301 ? ?

## 2022-03-22 NOTE — Discharge Instructions (Signed)

## 2022-03-22 NOTE — ED Triage Notes (Signed)
Patient ambulatory to triage with steady gait, without difficulty or distress noted; pt reports rt upper abd pain with no accomp symptoms for several days; seen by PCP recently, rx tramadol without relief; abd u/s negative ?

## 2022-04-13 ENCOUNTER — Other Ambulatory Visit: Payer: Self-pay

## 2022-04-13 MED ORDER — TRIAMCINOLONE ACETONIDE 0.1 % EX CREA
TOPICAL_CREAM | CUTANEOUS | 1 refills | Status: DC
  Filled 2022-04-13: qty 30, 10d supply, fill #0

## 2022-05-04 ENCOUNTER — Other Ambulatory Visit: Payer: Self-pay

## 2022-05-04 MED ORDER — AMOXICILLIN 875 MG PO TABS
875.0000 mg | ORAL_TABLET | Freq: Two times a day (BID) | ORAL | 0 refills | Status: DC
  Filled 2022-05-04: qty 14, 7d supply, fill #0

## 2022-07-14 ENCOUNTER — Other Ambulatory Visit: Payer: Self-pay

## 2022-07-14 MED ORDER — PREMARIN 0.625 MG/GM VA CREA
TOPICAL_CREAM | VAGINAL | 3 refills | Status: AC
Start: 2022-07-14 — End: ?
  Filled 2022-07-14: qty 30, 90d supply, fill #0
  Filled 2022-08-24: qty 30, 30d supply, fill #0

## 2022-07-19 ENCOUNTER — Telehealth: Payer: No Typology Code available for payment source | Admitting: Physician Assistant

## 2022-07-19 ENCOUNTER — Other Ambulatory Visit: Payer: Self-pay

## 2022-07-19 DIAGNOSIS — J019 Acute sinusitis, unspecified: Secondary | ICD-10-CM

## 2022-07-19 DIAGNOSIS — B9689 Other specified bacterial agents as the cause of diseases classified elsewhere: Secondary | ICD-10-CM

## 2022-07-19 MED ORDER — AMOXICILLIN-POT CLAVULANATE 875-125 MG PO TABS
1.0000 | ORAL_TABLET | Freq: Two times a day (BID) | ORAL | 0 refills | Status: DC
  Filled 2022-07-19: qty 14, 7d supply, fill #0

## 2022-07-19 NOTE — Progress Notes (Signed)

## 2022-08-04 ENCOUNTER — Other Ambulatory Visit: Payer: Self-pay

## 2022-08-22 ENCOUNTER — Other Ambulatory Visit: Payer: Self-pay

## 2022-08-22 MED ORDER — LEVOTHYROXINE SODIUM 88 MCG PO TABS
ORAL_TABLET | ORAL | 1 refills | Status: DC
  Filled 2022-08-22: qty 90, 90d supply, fill #0

## 2022-08-24 ENCOUNTER — Other Ambulatory Visit: Payer: Self-pay

## 2022-09-15 ENCOUNTER — Other Ambulatory Visit: Payer: Self-pay

## 2022-09-15 MED ORDER — ERGOCALCIFEROL 1.25 MG (50000 UT) PO CAPS
1.0000 | ORAL_CAPSULE | ORAL | 3 refills | Status: DC
  Filled 2022-09-15 – 2022-10-06 (×2): qty 13, 90d supply, fill #0

## 2022-09-29 ENCOUNTER — Other Ambulatory Visit: Payer: Self-pay

## 2022-10-06 ENCOUNTER — Other Ambulatory Visit: Payer: Self-pay

## 2022-12-04 ENCOUNTER — Other Ambulatory Visit: Payer: Self-pay

## 2022-12-25 ENCOUNTER — Other Ambulatory Visit: Payer: Self-pay

## 2023-01-10 ENCOUNTER — Ambulatory Visit (INDEPENDENT_AMBULATORY_CARE_PROVIDER_SITE_OTHER): Payer: Commercial Managed Care - PPO

## 2023-01-10 ENCOUNTER — Encounter: Payer: Self-pay | Admitting: Emergency Medicine

## 2023-01-10 ENCOUNTER — Ambulatory Visit
Admission: EM | Admit: 2023-01-10 | Discharge: 2023-01-10 | Disposition: A | Discharge status: Home / Self Care | Payer: Commercial Managed Care - PPO | Attending: Family Medicine | Admitting: Family Medicine

## 2023-01-10 DIAGNOSIS — R0781 Pleurodynia: Secondary | ICD-10-CM | POA: Insufficient documentation

## 2023-01-10 DIAGNOSIS — E039 Hypothyroidism, unspecified: Secondary | ICD-10-CM | POA: Insufficient documentation

## 2023-01-10 DIAGNOSIS — R1012 Left upper quadrant pain: Secondary | ICD-10-CM | POA: Insufficient documentation

## 2023-01-10 DIAGNOSIS — E785 Hyperlipidemia, unspecified: Secondary | ICD-10-CM | POA: Insufficient documentation

## 2023-01-10 DIAGNOSIS — D869 Sarcoidosis, unspecified: Secondary | ICD-10-CM | POA: Insufficient documentation

## 2023-01-10 DIAGNOSIS — W19XXXA Unspecified fall, initial encounter: Secondary | ICD-10-CM | POA: Diagnosis not present

## 2023-01-10 LAB — CBC WITH DIFFERENTIAL/PLATELET
Abs Immature Granulocytes: 0.04 10*3/uL (ref 0.00–0.07)
Basophils Absolute: 0.1 10*3/uL (ref 0.0–0.1)
Basophils Relative: 1 %
Eosinophils Absolute: 0.2 10*3/uL (ref 0.0–0.5)
Eosinophils Relative: 3 %
HCT: 37.9 % (ref 36.0–46.0)
Hemoglobin: 11.9 g/dL — ABNORMAL LOW (ref 12.0–15.0)
Immature Granulocytes: 1 %
Lymphocytes Relative: 20 %
Lymphs Abs: 1.6 10*3/uL (ref 0.7–4.0)
MCH: 27.2 pg (ref 26.0–34.0)
MCHC: 31.4 g/dL (ref 30.0–36.0)
MCV: 86.5 fL (ref 80.0–100.0)
Monocytes Absolute: 0.7 10*3/uL (ref 0.1–1.0)
Monocytes Relative: 9 %
Neutro Abs: 5.6 10*3/uL (ref 1.7–7.7)
Neutrophils Relative %: 66 %
Platelets: 322 10*3/uL (ref 150–400)
RBC: 4.38 MIL/uL (ref 3.87–5.11)
RDW: 13.3 % (ref 11.5–15.5)
WBC: 8.2 10*3/uL (ref 4.0–10.5)
nRBC: 0 % (ref 0.0–0.2)

## 2023-01-10 LAB — COMPREHENSIVE METABOLIC PANEL
ALT: 24 U/L (ref 0–44)
AST: 33 U/L (ref 15–41)
Albumin: 3.7 g/dL (ref 3.5–5.0)
Alkaline Phosphatase: 77 U/L (ref 38–126)
Anion gap: 5 (ref 5–15)
BUN: 14 mg/dL (ref 6–20)
CO2: 26 mmol/L (ref 22–32)
Calcium: 8.9 mg/dL (ref 8.9–10.3)
Chloride: 105 mmol/L (ref 98–111)
Creatinine, Ser: 0.96 mg/dL (ref 0.44–1.00)
GFR, Estimated: >60 mL/min (ref >60)
Glucose, Bld: 109 mg/dL — ABNORMAL HIGH (ref 70–99)
Potassium: 4.2 mmol/L (ref 3.5–5.1)
Sodium: 136 mmol/L (ref 135–145)
Total Bilirubin: 0.5 mg/dL (ref 0.3–1.2)
Total Protein: 7.8 g/dL (ref 6.5–8.1)

## 2023-01-10 LAB — LIPASE, BLOOD: Lipase: 38 U/L (ref 11–51)

## 2023-01-10 NOTE — Discharge Instructions (Signed)
Your lab work and imaging did not show cause of your pain. Continue taking Motrin for pain as needed.  Follow up with your primary care provider if your pain does not improve. If it worsens, go to the emergency department

## 2023-01-10 NOTE — ED Provider Notes (Signed)
[facility]    CSN: [id number] Arrival date & time: 01/10/23  3903      History   Chief Complaint Chief Complaint  Patient presents with   Abdominal Pain    HPI Barbara Harris is a 57 y.o. female.   HPI  Barbara Harris presents for LUQ abdominal pain.  Reports pain with deep breathing. Pain started Monday evening after a fall. She went to a dinner at a friends house and reached for a baby and the chair broke.  Pain better with Ibuprofen 200 mg and worse with walking. Last took ibuprofen last night.   She didn't eat dinner two nights before the fall due to abdominal discomfort. No trouble eating breakfast this morning. She couldn't sleep last night due to the pain.   Has some bruises on her upper and lower extremities from the fall (left knee and right shoulder).   Past Surgeries: C-section, no other abdominal surgeries   Symptoms Nausea/Vomiting: no  Diarrhea: no  Constipation: no  Melena/BRBPR: no  Hematemesis: no  Anorexia: no  Fever/Chills: no  Dysuria: no  Rash: bruising  Wt loss: no  NSAIDs/ASA: yes  Sore throat: no   Cough: no Sleep disturbance: yes Back Pain: no Headache: no   Past Medical History:  Diagnosis Date   Hyperlipidemia    Hypothyroidism    Sarcoidosis    Spondylolisthesis of lumbar region     There are no problems to display for this patient.   Past Surgical History:  Procedure Laterality Date   CESAREAN SECTION     COLONOSCOPY WITH PROPOFOL N/A 08/30/2018   Procedure: COLONOSCOPY WITH PROPOFOL;  Surgeon: Manya Silvas, MD;  Location: [facility] ENDOSCOPY;  Service: Endoscopy;  Laterality: N/A;    OB History     Gravida  1   Para  1   Term  1   Preterm      AB      Living  1      SAB      IAB      Ectopic      Multiple      Live Births  1            Home Medications    Prior to Admission medications   Medication Sig Start Date End Date Taking? Authorizing Provider  clotrimazole-betamethasone (LOTRISONE)  cream Apply topically 2 (two) times daily 12/19/21  Yes   conjugated estrogens (PREMARIN) vaginal cream Place 1 g vaginally twice a week 1 applicator twice weekly 07/14/22  Yes   ergocalciferol (VITAMIN D2) 1.25 MG (50000 UT) capsule Take 1 capsule (50,000 Units total) by mouth once a week. 09/15/22  Yes   levothyroxine (SYNTHROID) 88 MCG tablet Take 1 tablet by mouth every morning before breakfast (0630) ON AN EMPTY STOMACH WITH A GLASS OF WATER AT LEAST 30-60 MINUTES BEFORE BREAKFAST 08/22/22  Yes   Multiple Vitamin (MULTIVITAMIN) tablet Take 1 tablet by mouth daily.   Yes [provider]  rosuvastatin (CRESTOR) 5 MG tablet take one tablet by mouth once daily 04/08/21  Yes   triamcinolone cream (KENALOG) 0.1 % Apply thin layer to affected areas bid prn flared skin. Do not apply on clear skin 04/13/22  Yes   amoxicillin-clavulanate (AUGMENTIN) 875-125 MG tablet Take 1 tablet by mouth 2 (two) times daily. 07/19/22   Mar Daring, PA-C  cyclobenzaprine (FLEXERIL) 10 MG tablet Take 1 tablet (10 mg total) by mouth 3 (three) times daily as needed for muscle spasms. 03/22/22  Merlyn Lot, MD  fluconazole (DIFLUCAN) 100 MG tablet Take 1 tablet (100 mg total) by mouth once daily for 7 days 09/12/21   Mortimer Fries, PA-C  traMADol (ULTRAM) 50 MG tablet Take 1 tablet (50 mg total) by mouth every 6 (six) hours as needed for up to 5 days 03/20/22       Family History Family History  Problem Relation Age of Onset   Hypertension Father    Esophageal cancer Brother     Social History Social History   Tobacco Use   Smoking status: Never   Smokeless tobacco: Never  Vaping Use   Vaping Use: Never used  Substance Use Topics   Alcohol use: No   Drug use: No     Allergies   Patient has no known allergies.   Review of Systems Review of Systems :negative unless otherwise stated in HPI.      Physical Exam Triage Vital Signs ED Triage Vitals  Enc Vitals Group     BP      Pulse       Resp      Temp      Temp src      SpO2      Weight      Height      Head Circumference      Peak Flow      Pain Score      Pain Loc      Pain Edu?      Excl. in [place]?    No data found.  Updated Vital Signs BP 123/85 (BP Location: Left Arm)   Pulse 76   Temp 98.3 F (36.8 C) (Oral)   Resp 16   SpO2 100%   Visual Acuity Right Eye Distance:   Left Eye Distance:   Bilateral Distance:    Right Eye Near:   Left Eye Near:    Bilateral Near:     Physical Exam  GEN: well appearing female CV: regular rate and rhythm RESP: no increased work of breathing, clear to ascultation bilaterally ABD: Bowel sounds present. Soft, LUQ tenderness, non-distended. No guarding, no rebound, no appreciable hepatosplenomegaly, no CVA tenderness MSK: no extremity edema, baseline ROM of shoulders but with pain SKIN: warm, dry, ecchymosis of right posterior shoulder NEURO: alert, moves all extremities appropriately PSYCH: Normal affect, appropriate speech and behavior   UC Treatments / Results  Labs (all labs ordered are listed, but only abnormal results are displayed) Labs Reviewed  CBC WITH DIFFERENTIAL/PLATELET - Abnormal; Notable for the following components:      Result Value   Hemoglobin 11.9 (*)    All other components within normal limits  COMPREHENSIVE METABOLIC PANEL - Abnormal; Notable for the following components:   Glucose, Bld 109 (*)    All other components within normal limits  LIPASE, BLOOD    EKG   Radiology DG Ribs Unilateral W/Chest Left  Result Date: 01/10/2023 CLINICAL DATA:  Lower left rib pain after a fall. Initial encounter. EXAM: LEFT RIBS AND CHEST - 3+ VIEW COMPARISON:  None Available. FINDINGS: No fracture or other bone lesions are seen involving the ribs. There is no evidence of pneumothorax or pleural effusion. Both lungs are clear. Heart size and mediastinal contours are within normal limits. IMPRESSION: Negative for fracture.  Negative exam.  Electronically Signed   By: Inge Rise M.D.   On: 01/10/2023 09:50     Procedures Procedures (including critical care time)  Medications Ordered in UC Medications - No  data to display  Initial Impression / Assessment and Plan / UC Course  I have reviewed the triage vital signs and the nursing notes.  Pertinent labs & imaging results that were available during my care of the patient were reviewed by me and considered in my medical decision making (see chart for details).      Patient is a  57 y.o. female with history HLD, hypothyroidism, sarcoidosis who presents after having insidious RUQ  abdominal pain.    Overall, patient is well-appearing, well-hydrated, and in no acute distress.  Vital signs stable.  [place]aria is afebrile.  Exam is not concerning for an acute abdomen.  She had a fall and has tenderness along lower rib margin on the left. Obtained CXR with left rib series, CBC, CMP, and lipase.    On chart review, patient had a CT abdomen pelvis in Mar 22, 2022 that showed some questionable hepatic steatosis otherwise was grossly unremarkable.    CBC, CMP and lipase are unremarkable. No rib fractures or chest abnormalities seen on imaging.  Discussed ED evaluation for CT of abdomen to better elucidate the etiology of her pain. She declines ED evaluation at this time. She will wait to see if her pain resolves. Strict ED evaluation given and she voice understanding.    Follow-up, return and ED precautions given. Discussed MDM, treatment plan and plan for follow-up with patient who agrees with plan.    Final Clinical Impressions(s) / UC Diagnoses   Final diagnoses:  Left upper quadrant abdominal pain  Fall, initial encounter  Rib pain on left side     Discharge Instructions      Your lab work and imaging did not show cause of your pain. Continue taking Motrin for pain as needed.  Follow up with your primary care provider if your pain does not improve. If it worsens, go to  the emergency department      ED Prescriptions   None    PDMP not reviewed this encounter.   Lyndee Hensen, DO 01/12/23 1506

## 2023-01-10 NOTE — ED Triage Notes (Signed)
Pt presents with LUQ pain x 3 days. Pt has tried ibuprofen for pain relief and it helps for a few hours. Pt states 4 days ago she fell out of a chair and bruised her left leg and right shoulder, but does not remember hitting her abdomen.

## 2023-01-26 ENCOUNTER — Other Ambulatory Visit: Payer: Self-pay

## 2023-01-26 ENCOUNTER — Other Ambulatory Visit (HOSPITAL_COMMUNITY): Payer: Self-pay

## 2023-01-26 MED ORDER — VITAMIN D (ERGOCALCIFEROL) 1.25 MG (50000 UNIT) PO CAPS
50000.0000 [IU] | ORAL_CAPSULE | ORAL | 2 refills | Status: DC
  Filled 2023-01-26: qty 13, 90d supply, fill #0
  Filled 2023-07-03: qty 13, 90d supply, fill #1

## 2023-01-26 MED ORDER — ROSUVASTATIN CALCIUM 5 MG PO TABS
5.0000 mg | ORAL_TABLET | Freq: Every day | ORAL | 2 refills | Status: DC
  Filled 2023-01-26: qty 90, 90d supply, fill #0

## 2023-03-12 DIAGNOSIS — L853 Xerosis cutis: Secondary | ICD-10-CM | POA: Diagnosis not present

## 2023-03-12 DIAGNOSIS — Z0000 Encounter for general adult medical examination without abnormal findings: Secondary | ICD-10-CM | POA: Diagnosis not present

## 2023-03-12 DIAGNOSIS — R7309 Other abnormal glucose: Secondary | ICD-10-CM | POA: Diagnosis not present

## 2023-03-12 DIAGNOSIS — D649 Anemia, unspecified: Secondary | ICD-10-CM | POA: Diagnosis not present

## 2023-03-12 DIAGNOSIS — D869 Sarcoidosis, unspecified: Secondary | ICD-10-CM | POA: Diagnosis not present

## 2023-03-12 DIAGNOSIS — E039 Hypothyroidism, unspecified: Secondary | ICD-10-CM | POA: Diagnosis not present

## 2023-03-12 DIAGNOSIS — L52 Erythema nodosum: Secondary | ICD-10-CM | POA: Diagnosis not present

## 2023-03-12 DIAGNOSIS — R7989 Other specified abnormal findings of blood chemistry: Secondary | ICD-10-CM | POA: Diagnosis not present

## 2023-03-12 DIAGNOSIS — Z13220 Encounter for screening for lipoid disorders: Secondary | ICD-10-CM | POA: Diagnosis not present

## 2023-03-12 DIAGNOSIS — E611 Iron deficiency: Secondary | ICD-10-CM | POA: Diagnosis not present

## 2023-03-19 ENCOUNTER — Other Ambulatory Visit: Payer: Self-pay

## 2023-03-19 DIAGNOSIS — R7989 Other specified abnormal findings of blood chemistry: Secondary | ICD-10-CM | POA: Diagnosis not present

## 2023-03-19 DIAGNOSIS — E7800 Pure hypercholesterolemia, unspecified: Secondary | ICD-10-CM | POA: Diagnosis not present

## 2023-03-19 DIAGNOSIS — E039 Hypothyroidism, unspecified: Secondary | ICD-10-CM | POA: Diagnosis not present

## 2023-03-19 DIAGNOSIS — D508 Other iron deficiency anemias: Secondary | ICD-10-CM | POA: Diagnosis not present

## 2023-03-19 MED ORDER — LEVOTHYROXINE SODIUM 88 MCG PO TABS: ORAL_TABLET | ORAL | 1 refills | Status: DC

## 2023-05-16 ENCOUNTER — Other Ambulatory Visit: Payer: Self-pay | Admitting: Oncology

## 2023-05-16 DIAGNOSIS — Z006 Encounter for examination for normal comparison and control in clinical research program: Secondary | ICD-10-CM

## 2023-06-06 ENCOUNTER — Other Ambulatory Visit
Admission: RE | Admit: 2023-06-06 | Discharge: 2023-06-06 | Disposition: A | Discharge status: Home / Self Care | Payer: Self-pay | Source: Ambulatory Visit | Attending: Oncology | Admitting: Oncology

## 2023-06-06 DIAGNOSIS — Z139 Encounter for screening, unspecified: Secondary | ICD-10-CM | POA: Insufficient documentation

## 2023-06-11 ENCOUNTER — Other Ambulatory Visit: Payer: Self-pay | Admitting: Oncology

## 2023-06-11 DIAGNOSIS — Z139 Encounter for screening, unspecified: Secondary | ICD-10-CM

## 2023-06-28 ENCOUNTER — Other Ambulatory Visit: Payer: Self-pay

## 2023-06-28 DIAGNOSIS — H15102 Unspecified episcleritis, left eye: Secondary | ICD-10-CM | POA: Diagnosis not present

## 2023-06-28 MED ORDER — TOBRAMYCIN-DEXAMETHASONE 0.3-0.1 % OP SUSP
1.0000 [drp] | Freq: Four times a day (QID) | OPHTHALMIC | 0 refills | Status: DC
  Filled 2023-06-28: qty 5, 25d supply, fill #0

## 2023-07-16 ENCOUNTER — Other Ambulatory Visit: Payer: Self-pay

## 2023-07-16 DIAGNOSIS — R7309 Other abnormal glucose: Secondary | ICD-10-CM | POA: Diagnosis not present

## 2023-07-16 DIAGNOSIS — E7800 Pure hypercholesterolemia, unspecified: Secondary | ICD-10-CM | POA: Diagnosis not present

## 2023-07-16 DIAGNOSIS — F4321 Adjustment disorder with depressed mood: Secondary | ICD-10-CM | POA: Diagnosis not present

## 2023-07-16 DIAGNOSIS — E039 Hypothyroidism, unspecified: Secondary | ICD-10-CM | POA: Diagnosis not present

## 2023-07-16 DIAGNOSIS — R7989 Other specified abnormal findings of blood chemistry: Secondary | ICD-10-CM | POA: Diagnosis not present

## 2023-07-16 MED ORDER — CITALOPRAM HYDROBROMIDE 10 MG PO TABS
10.0000 mg | ORAL_TABLET | Freq: Every day | ORAL | 3 refills | Status: DC
  Filled 2023-07-16: qty 30, 30d supply, fill #0

## 2023-07-27 ENCOUNTER — Other Ambulatory Visit: Payer: Self-pay

## 2023-08-13 DIAGNOSIS — H524 Presbyopia: Secondary | ICD-10-CM | POA: Diagnosis not present

## 2023-08-13 DIAGNOSIS — H15102 Unspecified episcleritis, left eye: Secondary | ICD-10-CM | POA: Diagnosis not present

## 2023-08-13 DIAGNOSIS — H17821 Peripheral opacity of cornea, right eye: Secondary | ICD-10-CM | POA: Diagnosis not present

## 2023-09-03 ENCOUNTER — Other Ambulatory Visit: Payer: Self-pay | Admitting: Internal Medicine

## 2023-09-03 DIAGNOSIS — Z1231 Encounter for screening mammogram for malignant neoplasm of breast: Secondary | ICD-10-CM

## 2023-09-21 ENCOUNTER — Ambulatory Visit
Admission: RE | Admit: 2023-09-21 | Discharge: 2023-09-21 | Disposition: A | Discharge status: Home / Self Care | Payer: Commercial Managed Care - PPO | Source: Ambulatory Visit | Attending: Internal Medicine | Admitting: Internal Medicine

## 2023-09-21 DIAGNOSIS — Z1231 Encounter for screening mammogram for malignant neoplasm of breast: Secondary | ICD-10-CM | POA: Diagnosis not present

## 2023-09-27 ENCOUNTER — Encounter: Payer: Self-pay | Admitting: Medical Genetics

## 2023-09-27 DIAGNOSIS — E785 Hyperlipidemia, unspecified: Secondary | ICD-10-CM | POA: Insufficient documentation

## 2023-09-27 DIAGNOSIS — E7801 Familial hypercholesterolemia: Secondary | ICD-10-CM | POA: Insufficient documentation

## 2023-09-27 LAB — HELIX MOLECULAR SCREEN: Genetic Analysis Overall Interpretation: POSITIVE — AB

## 2023-09-28 ENCOUNTER — Telehealth: Payer: Self-pay | Admitting: Medical Genetics

## 2023-09-28 ENCOUNTER — Other Ambulatory Visit: Payer: Self-pay

## 2023-09-28 DIAGNOSIS — E7801 Familial hypercholesterolemia: Secondary | ICD-10-CM

## 2023-09-28 MED ORDER — LEVOTHYROXINE SODIUM 88 MCG PO TABS
88.0000 ug | ORAL_TABLET | Freq: Every morning | ORAL | 1 refills | Status: DC
  Filled 2023-09-28: qty 90, 90d supply, fill #0
  Filled 2024-02-29: qty 90, 90d supply, fill #1

## 2023-09-28 NOTE — Telephone Encounter (Signed)
[place] [facility] 09/28/2023 2:22 PM  Genetic counseling was offered and participant is scheduled. All questions were answered, and participant was thanked for their time and support of the above study. Participant was encouraged to contact [facility] if they have any further questions or concerns.    Newman Nip, BS [facility]  [facility] Clinical Research Specialist II Direct Dial: [phone]  Fax: [phone] Website: [facility]  [employer] Research Program  [facility]

## 2023-09-28 NOTE — Telephone Encounter (Signed)
[place] [facility] 09/28/2023 1:29 PM  FIRST ATTEMPT: Confirmed I was speaking with Barbara Harris [phone] by using name and DOB. Informed participant the reason for this call is to provide results for the above study. 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 Genetic counseling was offered and participant requests a call back to schedule. All questions were answered, and participant was thanked for their time and support of the above study. Participant was encouraged to contact [facility] if they have any further questions or concerns.

## 2023-10-01 ENCOUNTER — Other Ambulatory Visit: Payer: Self-pay

## 2023-10-01 MED ORDER — EZETIMIBE 10 MG PO TABS
10.0000 mg | ORAL_TABLET | Freq: Every day | ORAL | 3 refills | Status: DC
  Filled 2023-10-01: qty 90, 90d supply, fill #0

## 2023-10-01 MED ORDER — LEVOTHYROXINE SODIUM 88 MCG PO TABS
88.0000 ug | ORAL_TABLET | Freq: Every morning | ORAL | 1 refills | Status: DC
  Filled 2023-10-01: qty 90, 90d supply, fill #0

## 2023-11-23 DIAGNOSIS — Z79899 Other long term (current) drug therapy: Secondary | ICD-10-CM | POA: Diagnosis not present

## 2023-11-23 DIAGNOSIS — E7800 Pure hypercholesterolemia, unspecified: Secondary | ICD-10-CM | POA: Diagnosis not present

## 2023-11-23 DIAGNOSIS — E039 Hypothyroidism, unspecified: Secondary | ICD-10-CM | POA: Diagnosis not present

## 2023-11-23 DIAGNOSIS — R7989 Other specified abnormal findings of blood chemistry: Secondary | ICD-10-CM | POA: Diagnosis not present

## 2023-11-23 DIAGNOSIS — R7309 Other abnormal glucose: Secondary | ICD-10-CM | POA: Diagnosis not present

## 2023-11-23 DIAGNOSIS — F4321 Adjustment disorder with depressed mood: Secondary | ICD-10-CM | POA: Diagnosis not present

## 2023-11-30 ENCOUNTER — Other Ambulatory Visit: Payer: Self-pay

## 2023-11-30 MED ORDER — TRIAMCINOLONE ACETONIDE 0.1 % EX CREA
TOPICAL_CREAM | Freq: Two times a day (BID) | CUTANEOUS | 0 refills | Status: DC
  Filled 2023-11-30: qty 80, 30d supply, fill #0

## 2023-12-11 ENCOUNTER — Other Ambulatory Visit: Payer: Self-pay

## 2024-02-29 ENCOUNTER — Other Ambulatory Visit: Payer: Self-pay

## 2024-02-29 MED ORDER — VITAMIN D (ERGOCALCIFEROL) 1.25 MG (50000 UNIT) PO CAPS
50000.0000 [IU] | ORAL_CAPSULE | ORAL | 2 refills | Status: AC
  Filled 2024-02-29: qty 13, 90d supply, fill #0
  Filled 2024-09-18: qty 13, 90d supply, fill #1

## 2024-03-03 ENCOUNTER — Ambulatory Visit (HOSPITAL_BASED_OUTPATIENT_CLINIC_OR_DEPARTMENT_OTHER): Admitting: Internal Medicine

## 2024-03-03 ENCOUNTER — Other Ambulatory Visit: Payer: Self-pay

## 2024-03-03 ENCOUNTER — Telehealth: Payer: Self-pay | Admitting: Pharmacy Technician

## 2024-03-03 ENCOUNTER — Other Ambulatory Visit (HOSPITAL_COMMUNITY): Payer: Self-pay

## 2024-03-03 ENCOUNTER — Encounter (HOSPITAL_BASED_OUTPATIENT_CLINIC_OR_DEPARTMENT_OTHER): Payer: Self-pay | Admitting: Internal Medicine

## 2024-03-03 VITALS — BP 112/72 | HR 76 | Ht 60.0 in | Wt 111.4 lb

## 2024-03-03 DIAGNOSIS — E7801 Familial hypercholesterolemia: Secondary | ICD-10-CM

## 2024-03-03 DIAGNOSIS — E785 Hyperlipidemia, unspecified: Secondary | ICD-10-CM

## 2024-03-03 DIAGNOSIS — I700 Atherosclerosis of aorta: Secondary | ICD-10-CM | POA: Diagnosis not present

## 2024-03-03 MED ORDER — ROSUVASTATIN CALCIUM 5 MG PO TABS: 2.5000 mg | ORAL_TABLET | ORAL | Status: DC

## 2024-03-03 NOTE — Progress Notes (Signed)
 LIPID CLINIC CONSULT NOTE  Chief Complaint:  Dyslipidemia  Primary Care Physician: Antonio Baumgarten, MD  Primary Cardiologist:  None  HPI:  Barbara Harris is a 58 y.o. female who is being seen today for the evaluation of dyslipidemia at the request of Rex Castor, MD. this is a pleasant 58 year old [place] Asian female who was referred for evaluation management of dyslipidemia.  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  She had recent helix genetic testing for free through the health system.  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  She does not necessarily report any early onset heart disease in her family.  She says her brother does have high cholesterol.  Imaging in 2023 did show some aortic atherosclerosis.  Her recent lipids show total cholesterol 196, HDL 46, triglycerides 77 and LDL 135.  She has been on ezetimibe  and rosuvastatin  although has had side effects with this, including leg cramps.  She has reduced the dose that she takes in half of both the statin and the ezetimibe  and she reports taking them every other day.  Overall she feels that this is tolerable.  She does have prediabetes with an A1c of 6.4%.  PMHx:  Past Medical History:  Diagnosis Date   Hyperlipidemia    Hypothyroidism    Sarcoidosis    Spondylolisthesis of lumbar region     Past Surgical History:  Procedure Laterality Date   CESAREAN SECTION     COLONOSCOPY WITH PROPOFOL  N/A 08/30/2018   Procedure: COLONOSCOPY WITH PROPOFOL ;  Surgeon: Cassie Click, MD;  Location: [facility] ENDOSCOPY;  Service: Endoscopy;  Laterality: N/A;    FAMHx:  Family History  Problem Relation Age of Onset   Hypertension Father    Esophageal cancer Brother     SOCHx:   reports that she has never smoked. She has never used smokeless tobacco. She reports that she does not drink alcohol and does not use  drugs.  ALLERGIES:  No Known Allergies  ROS: Pertinent items noted in HPI and remainder of comprehensive ROS otherwise negative.  HOME MEDS: Current Outpatient Medications on File Prior to Visit  Medication Sig Dispense Refill   clotrimazole -betamethasone  (LOTRISONE ) cream Apply topically 2 (two) times daily 45 g 1   conjugated estrogens  (PREMARIN ) vaginal cream Place 1 g vaginally twice a week 1 applicator twice weekly 30 g 3   cyclobenzaprine  (FLEXERIL ) 10 MG tablet Take 1 tablet (10 mg total) by mouth 3 (three) times daily as needed for muscle spasms. 12 tablet 0   ezetimibe  (ZETIA ) 10 MG tablet Take 1 tablet (10 mg total) by mouth once daily 90 tablet 3   levothyroxine  (SYNTHROID ) 88 MCG tablet Take 1 tablet by mouth every morning before breakfast (0630) ON AN EMPTY STOMACH WITH A GLASS OF WATER AT LEAST 30-60 MINUTES BEFORE BREAKFAST 90 tablet 1   Multiple Vitamin (MULTIVITAMIN) tablet Take 1 tablet by mouth daily.     Vitamin D , Ergocalciferol , (DRISDOL ) 1.25 MG (50000 UNIT) CAPS capsule Take 1 capsule (50,000 Units total) by mouth once a week. 13 capsule 2   [DISCONTINUED] citalopram  (CELEXA ) 10 MG tablet Take 1 tablet (10 mg total) by mouth once daily 30 tablet 3   No current facility-administered medications on file prior to visit.    LABS/IMAGING: No results found for this or any previous visit (from the past 48 hours). No results found.  LIPID PANEL: No results found for: "  CHOL", "TRIG", "HDL", "CHOLHDL", "VLDL", "LDLCALC", "LDLDIRECT"  WEIGHTS: Wt Readings from Last 3 Encounters:  03/03/24 111 lb 6.4 oz (50.5 kg)  03/22/22 111 lb (50.3 kg)  07/08/21 110 lb (49.9 kg)    VITALS: BP 112/72 (BP Location: Left Arm, Patient Position: Sitting)   Pulse 76   Ht 5' (1.524 m)   Wt 111 lb 6.4 oz (50.5 kg)   SpO2 99%   BMI 21.76 kg/m   EXAM: General appearance: alert and no distress Extremities: Mild raised erythematous nodules over the left buttocks, left thigh, right  thigh  EKG: Deferred  ASSESSMENT: Heterozygous familial hyperlipidemia, APO B variant (pathogenic) Dyslipidemia, goal LDL less than 70 Aortic atherosclerosis 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   Barbara Harris has been found to have HeFH by whole exome genetic screening, including an APO B variant which is considered pathogenic.  There is no clear early onset heart disease in her family and her cholesterol is elevated but not to the degree we typically see with pathogenic APO B variations.  Nonetheless we would target her LDL cholesterol to be at least 50% lower and hopefully to an LDL less than 70 on treatment.  She does have some aortic atherosclerosis.  She cannot tolerate the statin on higher doses.  I would agree with continuing rosuvastatin  2.5 mg every other day but I think she should try to go onto the Zetia  10 mg daily.  She will likely need additional therapy and an option for her is a PCSK9 inhibitor.  I would like to assess for LP(a).  She also mentioned at the end of the visit today that she has some skin nodules.  These are red and raised and come out and present for several weeks and then go away.  They are not pruritic and they do not develop any pustules.  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  Would advise that she follow-up with her PCP regarding this.  Will reach out for prior authorization for Repatha.  We demonstrated the use of the injector today.  Repeat labs in follow-up likely in about 4 months.  Thanks again for the kind referral.  Barbara Lites, MD, [facility], FNLA, FACP  [place]  [facility]  Medical Director of the Advanced Lipid Disorders &  Cardiovascular Risk Reduction Clinic Diplomate of the American Board of Clinical Lipidology Attending Cardiologist  Direct Dial: [phone]  Fax: [phone]  Website:  [email or web address]  Barbara Harris 03/03/2024, 1:27 PM

## 2024-03-03 NOTE — Telephone Encounter (Signed)
 Pharmacy Patient Advocate Encounter   Received notification from Pt Calls Messages that prior authorization for repatha is required/requested.   Insurance verification completed.   The patient is insured through [facility] .   Per test claim: PA required; PA submitted to above mentioned insurance via CoverMyMeds Key/confirmation #/EOC [id number] Status is pending

## 2024-03-03 NOTE — Patient Instructions (Signed)
 Medication Instructions:   Continue crestor  2.5mg  every other day  Increase zetia  to every day   Dr. Maximo Spar recommends Repatha Sureclick 140mg /mL (PCSK9). This is an injectable cholesterol medication self-administered once every 14 days. This medication will likely need prior approval with your insurance company, which we will work on. If the medication is not approved initially, we may need to do an appeal with your insurance.   Administer medication in area of fatty tissue such as abdomen, outer thigh, back of upper arm - and rotate site with each injection Store medication in refrigerator until ready to administer - allow to sit at room temp for 30 mins - 1 hour prior to injection Dispose of medication in a SHARPS container - your pharmacy should be able to direct you on this and proper disposal   If you need a co-pay card for Repatha: Lawsponsor.fr If you need a co-pay card for Praluent: [email or web address]  Patient Assistance:    These foundations have funds at various times.   The PAN Foundation: https://www.panfoundation.org/disease-funds/hypercholesterolemia/ -- can sign up for wait list  The [facility] offers assistance to help pay for medication copays.  They will cover copays for all cholesterol lowering meds, including statins, fibrates, omega-3 fish oils like Vascepa, ezetimibe , Repatha, Praluent, Nexletol, Nexlizet.  The cards are usually good for $2,500 or 12 months, whichever comes first. Our fax # is [phone] (you will need this to apply) Go to healthwellfoundation.org Click on "Apply Now" Answer questions as to whom is applying (patient or representative) Your disease fund will be "hypercholesterolemia - Medicare access" They will ask questions about finances and which medications you are taking for cholesterol When you submit, the approval is usually within minutes.  You will need to print the card information  from the site You will need to show this information to your pharmacy, they will bill your Medicare Part D plan first -then bill Health Well --for the copay.   You can also call them at [phone], although the hold times can be quite long.     *If you need a refill on your cardiac medications before your next appointment, please call your pharmacy*  Lab Work: LPa today   FASTING lab work in 3-4 months   If you have labs (blood work) drawn today and your tests are completely normal, you will receive your results only by: MyChart Message (if you have MyChart) OR A paper copy in the mail If you have any lab test that is abnormal or we need to change your treatment, we will call you to review the results.  Follow-Up: At [facility], you and your health needs are our priority.  As part of our continuing mission to provide you with exceptional heart care, our providers are all part of one team.  This team includes your primary Cardiologist (physician) and Advanced Practice Providers or APPs (Physician Assistants and Nurse Practitioners) who all work together to provide you with the care you need, when you need it.  Your next appointment:    3-4 months with Slater Duncan NP  We recommend signing up for the patient portal called "MyChart".  Sign up information is provided on this After Visit Summary.  MyChart is used to connect with patients for Virtual Visits (Telemedicine).  Patients are able to view lab/test results, encounter notes, upcoming appointments, etc.  Non-urgent messages can be sent to your provider as well.   To learn more about what you can do with MyChart, go to  [email or web address].

## 2024-03-04 ENCOUNTER — Other Ambulatory Visit: Payer: Self-pay

## 2024-03-04 ENCOUNTER — Other Ambulatory Visit (HOSPITAL_COMMUNITY): Payer: Self-pay

## 2024-03-04 LAB — LIPOPROTEIN A (LPA): Lipoprotein (a): 87.1 nmol/L — ABNORMAL HIGH (ref <75.0)

## 2024-03-04 MED ORDER — REPATHA SURECLICK 140 MG/ML ~~LOC~~ SOAJ
140.0000 mg | SUBCUTANEOUS | 11 refills | Status: DC
  Filled 2024-03-04 – 2024-03-20 (×2): qty 2, 28d supply, fill #0
  Filled 2024-04-11: qty 2, 28d supply, fill #1
  Filled 2024-05-13: qty 2, 28d supply, fill #2
  Filled 2024-06-18 (×2): qty 2, 28d supply, fill #3
  Filled 2024-07-10: qty 2, 28d supply, fill #4
  Filled 2024-08-07: qty 2, 28d supply, fill #5

## 2024-03-04 NOTE — Addendum Note (Signed)
 Addended by: Francesco Inks on: 03/04/2024 02:05 PM   Modules accepted: Orders

## 2024-03-04 NOTE — Telephone Encounter (Signed)
 Pharmacy Patient Advocate Encounter  Received notification from [employer] that Prior Authorization for repatha has been APPROVED from 03/04/24 to 03/03/25. Ran test claim, Copay is $24.99. This test claim was processed through [facility]- copay amounts may vary at other pharmacies due to pharmacy/plan contracts, or as the patient moves through the different stages of their insurance plan.   PA #/Case ID/Reference #: [id number]

## 2024-03-04 NOTE — Telephone Encounter (Signed)
Update sent to patient in MyChart Rx(s) sent to pharmacy electronically.  

## 2024-03-06 ENCOUNTER — Other Ambulatory Visit: Payer: Self-pay

## 2024-03-12 ENCOUNTER — Other Ambulatory Visit: Payer: Self-pay

## 2024-03-13 ENCOUNTER — Other Ambulatory Visit: Payer: Self-pay

## 2024-03-13 MED ORDER — LEVOTHYROXINE SODIUM 88 MCG PO TABS
ORAL_TABLET | ORAL | 1 refills | Status: DC
  Filled 2024-03-13: qty 30, 30d supply, fill #0
  Filled 2024-04-11: qty 30, 30d supply, fill #1
  Filled 2024-07-10: qty 90, 90d supply, fill #2
  Filled 2024-10-01: qty 30, 30d supply, fill #3

## 2024-03-13 MED ORDER — ROSUVASTATIN CALCIUM 5 MG PO TABS
5.0000 mg | ORAL_TABLET | Freq: Every day | ORAL | 1 refills | Status: DC
  Filled 2024-03-13: qty 90, 90d supply, fill #0

## 2024-03-17 ENCOUNTER — Other Ambulatory Visit: Payer: Self-pay

## 2024-03-20 ENCOUNTER — Other Ambulatory Visit: Payer: Self-pay

## 2024-03-25 ENCOUNTER — Ambulatory Visit: Payer: Self-pay | Admitting: *Deleted

## 2024-03-28 ENCOUNTER — Institutional Professional Consult (permissible substitution) (HOSPITAL_BASED_OUTPATIENT_CLINIC_OR_DEPARTMENT_OTHER): Payer: Commercial Managed Care - PPO | Admitting: Internal Medicine

## 2024-04-15 ENCOUNTER — Other Ambulatory Visit: Payer: Self-pay

## 2024-05-15 NOTE — Addendum Note (Signed)
 Encounter addended by: Remus Noemi PARAS on: 05/15/2024 1:39 PM  Actions taken: Order list changed

## 2024-06-12 ENCOUNTER — Encounter (HOSPITAL_BASED_OUTPATIENT_CLINIC_OR_DEPARTMENT_OTHER): Payer: Self-pay | Admitting: *Deleted

## 2024-06-18 ENCOUNTER — Encounter (HOSPITAL_BASED_OUTPATIENT_CLINIC_OR_DEPARTMENT_OTHER): Admitting: Nurse Practitioner

## 2024-06-18 NOTE — Progress Notes (Deleted)
  Cardiology Office Note   Date:  06/18/2024  ID:  Barbara Harris, DOB [birth year], MRN [id number] PCP: Sadie Manna, MD  [facility] Providers Cardiologist:  None { Click to update primary MD,subspecialty MD or APP then REFRESH:1}    Referred to advanced lipid disorder clinic and seen by Dr. Mona 03/03/2024.  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  She had recent [employer] genetic testing through our health system.  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  She does not report any early onset heart disease in her family.  She has a brother who has high cholesterol.  Imaging in 2023 showed aortic atherosclerosis.  Recent lipid panel revealed total cholesterol 196, HDL 46, triglycerides 77, and LDL 135.  She has been on ezetimibe  and rosuvastatin  but had side effects including leg cramps.  She reduce the dose to take half of each of these every other day.  She felt this combination was tolerable.  History of prediabetes with A1c 6.4%.  History of Present Illness Barbara Harris is a 58 y.o. female ***  ROS: ***  Studies Reviewed      ***  Lipoprotein (a)  Date/Time Value Ref Range Status  03/03/2024 10:58 AM 87.1 (H) <75.0 nmol/L Final    Comment:    Note:  Values greater than or equal to 75.0 nmol/L may        indicate an independent risk factor for CHD,        but must be evaluated with caution when applied        to non-Caucasian populations due to the        influence of genetic factors on Lp(a) across        ethnicities.     Risk Assessment/Calculations {Does this patient have ATRIAL FIBRILLATION?[phone]} No BP recorded.  {Refresh Note OR Click here to enter BP  :1}***       Physical Exam VS:  There were no vitals taken for this visit.   Wt Readings from Last 3 Encounters:  03/03/24 111 lb 6.4 oz (50.5 kg)   03/22/22 111 lb (50.3 kg)  07/08/21 110 lb (49.9 kg)    GEN: Well nourished, well developed in no acute distress NECK: No JVD; No carotid bruits CARDIAC: ***RRR, no murmurs, rubs, gallops RESPIRATORY:  Clear to auscultation without rales, wheezing or rhonchi  ABDOMEN: Soft, non-tender, non-distended EXTREMITIES:  No edema; No deformity   ASSESSMENT AND PLAN ***    {Are you ordering a CV Procedure (e.g. stress test, cath, DCCV, TEE, etc)?   Press F2        :[id number]}  Dispo: ***  Signed, Rosaline Bane, NP-C

## 2024-07-11 ENCOUNTER — Other Ambulatory Visit: Payer: Self-pay

## 2024-07-28 ENCOUNTER — Encounter (HOSPITAL_BASED_OUTPATIENT_CLINIC_OR_DEPARTMENT_OTHER): Admitting: Nurse Practitioner

## 2024-08-12 NOTE — Progress Notes (Signed)
 " Cardiology Office Note   Date:  08/14/2024  ID:  Barbara Harris, DOB 01-Apr-1966, MRN [id number] PCP: Barbara Manna, MD  [facility] Providers Cardiologist:  None     PMH Aortic atherosclerosis Dyslipidemia Heterozygous familial hyperlipidemia, APO B variant Prediabetes Mildly elevated LP(a)  Referred to Advanced lipid disorders clinic and seen by Dr. Mona 03/03/2024.  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  She underwent [employer] genetic testing through the [facility] system.  This indicated an APO B variant (c. 10579C>T), which is considered pathogenic for heterozygous familial hyperlipidemia.  She does not report any early onset heart disease in her family.  Her brother has high cholesterol.  Imaging for her in 2023 showed aortic atherosclerosis.  Most recent lipid panel showed total cholesterol 196, HDL 46, triglycerides 77 and LDL 135.  She has been on ezetimibe  and rosuvastatin  although she had side effects with this including leg cramps.  She reduce the dose and takes half of both rosuvastatin  and ezetimibe  every other day which she feels is tolerable.  A1c 6.4% indicating prediabetes.  She was advised to continue rosuvastatin  2.5 mg every other day but to try to increase Zetia  to 10 mg daily.  She also reported red raised skin nodules that are present for several weeks and then go away.  They are not pruritic and do not develop pustules.  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  She was advised to start Repatha  140 mg every 14 days.  LP(a) is mildly elevated at 87.1.  LDL on 10/1 103, trigs 175, total 177 9/16 LDL 84, total chol 142, trigs 110  History of Present Illness  Discussed the use of AI scribe software for clinical note transcription with the patient, who gave verbal consent to proceed.  History of Present Illness Barbara Harris is a 58 year old female who is here today for management of dyslipidemia. She had recent lab testing through a study  that she is participating in.  Her LDL was 84 and triglycerides were 110 on 07/22/2024. On 08/06/2024 triglycerides were elevated at 175 and LDL was 103.  At time labs completed on 10/1 she was 2 days late with her Repatha  injection which she believes contributed to variations in her cholesterol levels. CT of abdomen in 2023 revealed aortic atherosclerosis, but there is no mention of coronary artery calcification in previous scans. She denies chest pain, shortness of breath, palpitations, orthopnea, PND, edema, presyncope, syncope. She has made dietary changes, including switching to olive oil, reducing sugar intake, and using alternatives like jaggery or monk sugar in moderation. She consumes small portions of rice every other day and is considering switching to brown rice. She walks daily for at least 40 minutes and has no exercise-induced symptoms.  She is working to reduce her A1c which was 6.4% on 11/23/2023.  ROS: See HPI  Studies Reviewed EKG Interpretation Date/Time:  Wednesday August 13 2024 15:37:24 EDT Ventricular Rate:  68 PR Interval:  110 QRS Duration:  74 QT Interval:  398 QTC Calculation: 423 R Axis:   63  Text Interpretation: Sinus rhythm with short PR Septal infarct , age undetermined No previous ECGs available Confirmed by Barbara Harris [phone]) on 08/13/2024 3:44:13 PM     Lipoprotein (a)  Date/Time Value Ref Range Status  03/03/2024 10:58 AM 87.1 (H) <75.0 nmol/L Final    Comment:    Note:  Values greater than or equal to 75.0 nmol/L may  indicate an independent risk factor for CHD,        but must be evaluated with caution when applied        to non-Caucasian populations due to the        influence of genetic factors on Lp(a) across        ethnicities.     Risk Assessment/Calculations           Physical Exam VS:  BP 116/68   Pulse 68   Ht 5' (1.524 m)   Wt 112 lb (50.8 kg)   SpO2 98%   BMI 21.87 kg/m    Wt Readings from Last 3 Encounters:   08/13/24 112 lb (50.8 kg)  03/03/24 111 lb 6.4 oz (50.5 kg)  03/22/22 111 lb (50.3 kg)    GEN: Well nourished, well developed in no acute distress NECK: No JVD; No carotid bruits CARDIAC: RRR, no murmurs, rubs, gallops RESPIRATORY:  Clear to auscultation without rales, wheezing or rhonchi  ABDOMEN: Soft, non-tender, non-distended EXTREMITIES:  No edema; No deformity    Assessment & Plan Aortic atherosclerosis  Cardiac risk Abnormal EKG Aortic atherosclerosis was identified on CT in 2023. No prior evaluation for coronary calcification.  EKG today reveals SR at 68 bpm with short PR, septal infarct age undetermined.  Prior EKG 03/22/2022 is not visible but report states no ST/T abnormality.  She denies chest pain, dyspnea, or other symptoms concerning for angina. No indication for further ischemic evaluation at this time - Order CT calcium  score for further risk stratification - Continue Repatha  -Heart healthy diet -Aim for at least 150 minutes of moderate intensity exercise each week  Familial hypercholesterolemia Mildly elevated LP(a) Hyperlipidemia LDL goal < 70 History of heterozygous familial hyperlipidemia and APO B variant.  LP(a) mildly elevated at 87.1 nmol/L. LDL improved from 135 mg/dL to 84 mg/dL with Repatha . Goal LDL is 70 mg/dL or lower due to elevated lipoprotein A and aortic atherosclerosis. Discussed dietary modifications and potential new medications for lipoprotein A. Elevated calcium  score may necessitate more aggressive LDL management and aspirin therapy.  -Continue Repatha  140 mg every 14 days -She requests 39-month supply - Encouraged dietary modifications to potentially reduce LDL and triglycerides        Dispo: TBD following CT calcium  score  Signed, Barbara Bane, NP-C "

## 2024-08-13 ENCOUNTER — Ambulatory Visit (HOSPITAL_BASED_OUTPATIENT_CLINIC_OR_DEPARTMENT_OTHER): Admitting: Nurse Practitioner

## 2024-08-13 ENCOUNTER — Other Ambulatory Visit: Payer: Self-pay

## 2024-08-13 ENCOUNTER — Encounter (HOSPITAL_BASED_OUTPATIENT_CLINIC_OR_DEPARTMENT_OTHER): Payer: Self-pay | Admitting: Nurse Practitioner

## 2024-08-13 VITALS — BP 116/68 | HR 68 | Ht 60.0 in | Wt 112.0 lb

## 2024-08-13 DIAGNOSIS — Z7189 Other specified counseling: Secondary | ICD-10-CM | POA: Diagnosis not present

## 2024-08-13 DIAGNOSIS — E7841 Elevated Lipoprotein(a): Secondary | ICD-10-CM | POA: Diagnosis not present

## 2024-08-13 DIAGNOSIS — R9431 Abnormal electrocardiogram [ECG] [EKG]: Secondary | ICD-10-CM | POA: Diagnosis not present

## 2024-08-13 DIAGNOSIS — E78019 Familial hypercholesterolemia, unspecified: Secondary | ICD-10-CM | POA: Diagnosis not present

## 2024-08-13 DIAGNOSIS — E785 Hyperlipidemia, unspecified: Secondary | ICD-10-CM | POA: Diagnosis not present

## 2024-08-13 DIAGNOSIS — I700 Atherosclerosis of aorta: Secondary | ICD-10-CM | POA: Diagnosis not present

## 2024-08-13 MED ORDER — REPATHA SURECLICK 140 MG/ML ~~LOC~~ SOAJ
140.0000 mg | SUBCUTANEOUS | 3 refills | Status: AC
  Filled 2024-08-13: qty 6, 84d supply, fill #0
  Filled 2024-10-06 – 2024-10-09 (×4): qty 6, 84d supply, fill #1

## 2024-08-13 NOTE — Patient Instructions (Addendum)
 Medication Instructions:  Your physician recommends that you continue on your current medications as directed. Please refer to the Current Medication list given to you today.   Labwork: NONE  Testing/Procedures: Your physician has recommend you to have a coronary calcium  score. This is a self pay test that will cost $99  Follow-Up: TO BE DETERMINED AFTER CALCIUM  SCORE

## 2024-08-14 ENCOUNTER — Encounter (HOSPITAL_BASED_OUTPATIENT_CLINIC_OR_DEPARTMENT_OTHER): Payer: Self-pay | Admitting: Nurse Practitioner

## 2024-08-15 ENCOUNTER — Ambulatory Visit
Admission: RE | Admit: 2024-08-15 | Discharge: 2024-08-15 | Disposition: A | Discharge status: Home / Self Care | Payer: Self-pay | Source: Ambulatory Visit | Attending: Family | Admitting: Family

## 2024-08-15 DIAGNOSIS — E785 Hyperlipidemia, unspecified: Secondary | ICD-10-CM | POA: Insufficient documentation

## 2024-08-18 ENCOUNTER — Encounter (HOSPITAL_BASED_OUTPATIENT_CLINIC_OR_DEPARTMENT_OTHER): Payer: Self-pay

## 2024-08-18 ENCOUNTER — Ambulatory Visit (HOSPITAL_BASED_OUTPATIENT_CLINIC_OR_DEPARTMENT_OTHER): Payer: Self-pay | Admitting: Family

## 2024-08-19 ENCOUNTER — Other Ambulatory Visit: Payer: Self-pay

## 2024-08-25 DIAGNOSIS — H15102 Unspecified episcleritis, left eye: Secondary | ICD-10-CM | POA: Diagnosis not present

## 2024-08-25 DIAGNOSIS — H524 Presbyopia: Secondary | ICD-10-CM | POA: Diagnosis not present

## 2024-08-26 ENCOUNTER — Other Ambulatory Visit: Payer: Self-pay | Admitting: Internal Medicine

## 2024-08-26 DIAGNOSIS — Z1231 Encounter for screening mammogram for malignant neoplasm of breast: Secondary | ICD-10-CM

## 2024-09-04 ENCOUNTER — Ambulatory Visit: Admitting: Podiatry

## 2024-09-04 DIAGNOSIS — M722 Plantar fascial fibromatosis: Secondary | ICD-10-CM | POA: Diagnosis not present

## 2024-09-04 DIAGNOSIS — M21961 Unspecified acquired deformity of right lower leg: Secondary | ICD-10-CM

## 2024-09-04 DIAGNOSIS — M21962 Unspecified acquired deformity of left lower leg: Secondary | ICD-10-CM

## 2024-09-04 NOTE — Progress Notes (Unsigned)
 Subjective:  Patient ID: Barbara Harris, female    DOB: 24-Apr-1966,  MRN: [id number]  Chief Complaint  Patient presents with   Foot Pain    Right heel pain  Pt stated that she went camping about a month ago and the pain started then     58 y.o. female presents with the above complaint.  Patient presents for right heel pain that has been off for quite some time is progressive and worsens with ambulation hard to pressure she states she went camping about a month ago did some running and the pain started then.  Pain scale 7 out of 10 hurts with ambulation or shoe pressure has not seen an well to see me for this.  Denies any other acute complaints.   Review of Systems: Negative except as noted in the HPI. Denies N/V/F/Ch.  Past Medical History:  Diagnosis Date   Hyperlipidemia    Hypothyroidism    Sarcoidosis    Spondylolisthesis of lumbar region     Current Outpatient Medications:    clotrimazole -betamethasone  (LOTRISONE ) cream, Apply topically 2 (two) times daily (Patient taking differently: Apply topically 2 (two) times daily as needed.), Disp: 45 g, Rfl: 1   conjugated estrogens  (PREMARIN ) vaginal cream, Place 1 g vaginally twice a week 1 applicator twice weekly, Disp: 30 g, Rfl: 3   cyclobenzaprine  (FLEXERIL ) 10 MG tablet, Take 1 tablet (10 mg total) by mouth 3 (three) times daily as needed for muscle spasms., Disp: 12 tablet, Rfl: 0   Evolocumab  (REPATHA  SURECLICK) 140 MG/ML SOAJ, Inject 140 mg into the skin every 14 (fourteen) days., Disp: 6 mL, Rfl: 3   levothyroxine  (SYNTHROID ) 88 MCG tablet, Take 1 tablet (88 mcg total) by mouth every morning 30-60 minutes before breakfast with a full glass of water, Disp: 90 tablet, Rfl: 1   Multiple Vitamin (MULTIVITAMIN) tablet, Take 1 tablet by mouth daily., Disp: , Rfl:    Vitamin D , Ergocalciferol , (DRISDOL ) 1.25 MG (50000 UNIT) CAPS capsule, Take 1 capsule (50,000 Units total) by mouth once a week., Disp: 13 capsule, Rfl: 2  Social History    Tobacco Use  Smoking Status Never   Passive exposure: Never  Smokeless Tobacco Never    No Known Allergies Objective:  There were no vitals filed for this visit. There is no height or weight on file to calculate BMI. Constitutional Well developed. Well nourished.  Vascular Dorsalis pedis pulses palpable bilaterally. Posterior tibial pulses palpable bilaterally. Capillary refill normal to all digits.  No cyanosis or clubbing noted. Pedal hair growth normal.  Neurologic Normal speech. Oriented to person, place, and time. Epicritic sensation to light touch grossly present bilaterally.  Dermatologic Nails well groomed and normal in appearance. No open wounds. No skin lesions.  Orthopedic: Normal joint ROM without pain or crepitus bilaterally. No visible deformities. Tender to palpation at the calcaneal tuber right. No pain with calcaneal squeeze right. Ankle ROM diminished range of motion right. Silfverskiold Test: positive right.   Radiographs: None  Assessment:   1. Plantar fasciitis of right foot   2. Foot deformity, bilateral    Plan:  Patient was evaluated and treated and all questions answered.  Plantar Fasciitis, right - XR reviewed as above.  - Educated on icing and stretching. Instructions given.  - Injection delivered to the plantar fascia as below. - DME: Plantar fascial brace dispensed to support the medial longitudinal arch of the foot and offload pressure from the heel and prevent arch collapse during weightbearing - Pharmacologic management: None  Procedure: Injection Tendon/Ligament Location: Right plantar fascia at the glabrous junction; medial approach. Skin Prep: alcohol Injectate: 0.5 cc 0.5% marcaine plain, 0.5 cc of 1% Lidocaine , 0.5 cc kenalog  10. Disposition: Patient tolerated procedure well. Injection site dressed with a band-aid.  No follow-ups on file.  Pes cavus/foot deformity -I explained to patient the etiology of pes planovalgus  and relationship with heel pain/arch pain and various treatment options were discussed.  Given patient foot structure in the setting of heel pain/arch pain I believe patient will benefit from custom-made orthotics to help control the hindfoot motion support the arch of the foot and take the stress away from arches.  Patient agrees with the plan like to proceed with orthotics -Patient was casted for orthotics

## 2024-09-05 ENCOUNTER — Ambulatory Visit (HOSPITAL_BASED_OUTPATIENT_CLINIC_OR_DEPARTMENT_OTHER)

## 2024-09-18 ENCOUNTER — Other Ambulatory Visit: Payer: Self-pay

## 2024-09-26 ENCOUNTER — Ambulatory Visit
Admission: RE | Admit: 2024-09-26 | Discharge: 2024-09-26 | Disposition: A | Discharge status: Home / Self Care | Source: Ambulatory Visit | Attending: Internal Medicine | Admitting: Internal Medicine

## 2024-09-26 DIAGNOSIS — Z1231 Encounter for screening mammogram for malignant neoplasm of breast: Secondary | ICD-10-CM | POA: Insufficient documentation

## 2024-10-01 ENCOUNTER — Other Ambulatory Visit: Payer: Self-pay

## 2024-10-06 ENCOUNTER — Other Ambulatory Visit: Payer: Self-pay

## 2024-10-08 ENCOUNTER — Other Ambulatory Visit: Payer: Self-pay

## 2024-10-09 ENCOUNTER — Other Ambulatory Visit: Payer: Self-pay

## 2024-10-09 ENCOUNTER — Ambulatory Visit: Admitting: Podiatry

## 2024-10-09 DIAGNOSIS — M62461 Contracture of muscle, right lower leg: Secondary | ICD-10-CM

## 2024-10-09 DIAGNOSIS — M722 Plantar fascial fibromatosis: Secondary | ICD-10-CM

## 2024-10-09 NOTE — Progress Notes (Signed)
 Subjective:  Patient ID: Barbara Harris, female    DOB: 1966-01-05,  MRN: [id number]  Chief Complaint  Patient presents with   Plantar Fasciitis    58 y.o. female presents with the above complaint.  Patient presents for follow-up of her right plantar fascia she states she is doing better she still has some residual pain.  She is leaving for a trip to [place].  She wanted to discuss lumbar treatment options if possible.  Denies any other acute complaints   Review of Systems: Negative except as noted in the HPI. Denies N/V/F/Ch.  Past Medical History:  Diagnosis Date   Hyperlipidemia    Hypothyroidism    Sarcoidosis    Spondylolisthesis of lumbar region     Current Outpatient Medications:    clotrimazole -betamethasone  (LOTRISONE ) cream, Apply topically 2 (two) times daily (Patient taking differently: Apply topically 2 (two) times daily as needed.), Disp: 45 g, Rfl: 1   conjugated estrogens  (PREMARIN ) vaginal cream, Place 1 g vaginally twice a week 1 applicator twice weekly, Disp: 30 g, Rfl: 3   cyclobenzaprine  (FLEXERIL ) 10 MG tablet, Take 1 tablet (10 mg total) by mouth 3 (three) times daily as needed for muscle spasms., Disp: 12 tablet, Rfl: 0   Evolocumab  (REPATHA  SURECLICK) 140 MG/ML SOAJ, Inject 140 mg into the skin every 14 (fourteen) days., Disp: 6 mL, Rfl: 3   levothyroxine  (SYNTHROID ) 88 MCG tablet, Take 1 tablet (88 mcg total) by mouth every morning 30-60 minutes before breakfast with a full glass of water, Disp: 90 tablet, Rfl: 1   Multiple Vitamin (MULTIVITAMIN) tablet, Take 1 tablet by mouth daily., Disp: , Rfl:    Vitamin D , Ergocalciferol , (DRISDOL ) 1.25 MG (50000 UNIT) CAPS capsule, Take 1 capsule (50,000 Units total) by mouth once a week., Disp: 13 capsule, Rfl: 2  Social History   Tobacco Use  Smoking Status Never   Passive exposure: Never  Smokeless Tobacco Never    No Known Allergies Objective:  There were no vitals filed for this visit. There is no height or  weight on file to calculate BMI. Constitutional Well developed. Well nourished.  Vascular Dorsalis pedis pulses palpable bilaterally. Posterior tibial pulses palpable bilaterally. Capillary refill normal to all digits.  No cyanosis or clubbing noted. Pedal hair growth normal.  Neurologic Normal speech. Oriented to person, place, and time. Epicritic sensation to light touch grossly present bilaterally.  Dermatologic Nails well groomed and normal in appearance. No open wounds. No skin lesions.  Orthopedic: Normal joint ROM without pain or crepitus bilaterally. No visible deformities. Tender to palpation at the calcaneal tuber right. No pain with calcaneal squeeze right. Ankle ROM diminished range of motion right. Silfverskiold Test: positive right.   Radiographs: None  Assessment:   No diagnosis found.  Plan:  Patient was evaluated and treated and all questions answered.  Plantar Fasciitis, right with underlying gastrocnemius equinus - XR reviewed as above.  - Educated on icing and stretching. Instructions given.  - second Injection delivered to the plantar fascia as below. - DME: Plantar fascial brace dispensed to support the medial longitudinal arch of the foot and offload pressure from the heel and prevent arch collapse during weightbearing - Pharmacologic management: None  Procedure: Injection Tendon/Ligament Location: Right plantar fascia at the glabrous junction; medial approach. Skin Prep: alcohol Injectate: 0.5 cc 0.5% marcaine plain, 0.5 cc of 1% Lidocaine , 0.5 cc kenalog  10. Disposition: Patient tolerated procedure well. Injection site dressed with a band-aid.  No follow-ups on file.  Pes cavus/foot deformity -  I explained to patient the etiology of pes planovalgus and relationship with heel pain/arch pain and various treatment options were discussed.  Given patient foot structure in the setting of heel pain/arch pain I believe patient will benefit from  custom-made orthotics to help control the hindfoot motion support the arch of the foot and take the stress away from arches.  Patient agrees with the plan like to proceed with orthotics -Patient was casted for orthotics

## 2024-10-24 ENCOUNTER — Telehealth: Payer: Self-pay

## 2024-10-24 NOTE — Telephone Encounter (Signed)
 Patient's orthotics are in and being sent to [facility]. Balance is $84.18.

## 2024-12-02 ENCOUNTER — Ambulatory Visit: Admitting: Podiatry

## 2024-12-02 DIAGNOSIS — M722 Plantar fascial fibromatosis: Secondary | ICD-10-CM | POA: Diagnosis not present

## 2024-12-02 NOTE — Progress Notes (Signed)
 "  Subjective:  Patient ID: Barbara Harris, female    DOB: [birth year],  MRN: [id number]  Chief Complaint  Patient presents with   Routine Post Op     follow up on right foot-pick up orthotics    59 y.o. female presents with the above complaint.  Patient presents with follow-up for right plantar fasciitis.  She states that she is doing great.  She was able to tolerate her trip to [place].  Pain scale is 0 out of 10.   Review of Systems: Negative except as noted in the HPI. Denies N/V/F/Ch.  Past Medical History:  Diagnosis Date   Hyperlipidemia    Hypothyroidism    Sarcoidosis    Spondylolisthesis of lumbar region     Current Outpatient Medications:    clotrimazole -betamethasone  (LOTRISONE ) cream, Apply topically 2 (two) times daily (Patient taking differently: Apply topically 2 (two) times daily as needed.), Disp: 45 g, Rfl: 1   conjugated estrogens  (PREMARIN ) vaginal cream, Place 1 g vaginally twice a week 1 applicator twice weekly, Disp: 30 g, Rfl: 3   cyclobenzaprine  (FLEXERIL ) 10 MG tablet, Take 1 tablet (10 mg total) by mouth 3 (three) times daily as needed for muscle spasms., Disp: 12 tablet, Rfl: 0   Evolocumab  (REPATHA  SURECLICK) 140 MG/ML SOAJ, Inject 140 mg into the skin every 14 (fourteen) days., Disp: 6 mL, Rfl: 3   levothyroxine  (SYNTHROID ) 88 MCG tablet, Take 1 tablet (88 mcg total) by mouth every morning 30-60 minutes before breakfast with a full glass of water, Disp: 90 tablet, Rfl: 1   Multiple Vitamin (MULTIVITAMIN) tablet, Take 1 tablet by mouth daily., Disp: , Rfl:    Vitamin D , Ergocalciferol , (DRISDOL ) 1.25 MG (50000 UNIT) CAPS capsule, Take 1 capsule (50,000 Units total) by mouth once a week., Disp: 13 capsule, Rfl: 2  Social History   Tobacco Use  Smoking Status Never   Passive exposure: Never  Smokeless Tobacco Never    No Known Allergies Objective:  There were no vitals filed for this visit. There is no height or weight on file to calculate  BMI. Constitutional Well developed. Well nourished.  Vascular Dorsalis pedis pulses palpable bilaterally. Posterior tibial pulses palpable bilaterally. Capillary refill normal to all digits.  No cyanosis or clubbing noted. Pedal hair growth normal.  Neurologic Normal speech. Oriented to person, place, and time. Epicritic sensation to light touch grossly present bilaterally.  Dermatologic Nails well groomed and normal in appearance. No open wounds. No skin lesions.  Orthopedic: Normal joint ROM without pain or crepitus bilaterally. No visible deformities. No further tender to palpation at the calcaneal tuber right. No pain with calcaneal squeeze right. Ankle ROM diminished range of motion right. Silfverskiold Test: positive right.   Radiographs: None  Assessment:   No diagnosis found.  Plan:  Patient was evaluated and treated and all questions answered.  Plantar Fasciitis, right with underlying gastrocnemius equinus - Clinically healed no further signs of plantar fasciitis noted.  At this time I discussed shoe gear modification orthotics management she states understanding if any foot and ankle issues in the future she will come back and see me.  No follow-ups on file.  Pes cavus/foot deformity -I explained to patient the etiology of pes planovalgus and relationship with heel pain/arch pain and various treatment options were discussed.  Given patient foot structure in the setting of heel pain/arch pain I believe patient will benefit from custom-made orthotics to help control the hindfoot motion support the arch of the foot and take  the stress away from arches.  Patient agrees with the plan like to proceed with orthotics - Orthotics are dispensed and functioning well no acute complaints break-in period was discussed  "

## 2024-12-04 ENCOUNTER — Other Ambulatory Visit: Payer: Self-pay

## 2024-12-04 MED ORDER — LEVOTHYROXINE SODIUM 88 MCG PO TABS
88.0000 ug | ORAL_TABLET | Freq: Every day | ORAL | 3 refills | Status: AC
  Filled 2024-12-04: qty 90, 90d supply, fill #0

## 2024-12-10 ENCOUNTER — Encounter: Payer: Self-pay | Admitting: Podiatry
